# Patient Record
Sex: Female | Born: 1945
Health system: Southern US, Community
[De-identification: ages and names within clinical notes are randomized; demographics above are authoritative.]

## PROBLEM LIST (undated history)

## (undated) DIAGNOSIS — Z8719 Personal history of other diseases of the digestive system: Secondary | ICD-10-CM

## (undated) DIAGNOSIS — E785 Hyperlipidemia, unspecified: Secondary | ICD-10-CM

## (undated) DIAGNOSIS — M542 Cervicalgia: Secondary | ICD-10-CM

## (undated) DIAGNOSIS — M1990 Unspecified osteoarthritis, unspecified site: Secondary | ICD-10-CM

## (undated) DIAGNOSIS — I10 Essential (primary) hypertension: Secondary | ICD-10-CM

## (undated) DIAGNOSIS — I499 Cardiac arrhythmia, unspecified: Secondary | ICD-10-CM

## (undated) DIAGNOSIS — K5790 Diverticulosis of intestine, part unspecified, without perforation or abscess without bleeding: Secondary | ICD-10-CM

## (undated) DIAGNOSIS — R112 Nausea with vomiting, unspecified: Secondary | ICD-10-CM

## (undated) DIAGNOSIS — Z9889 Other specified postprocedural states: Secondary | ICD-10-CM

## (undated) DIAGNOSIS — K589 Irritable bowel syndrome without diarrhea: Secondary | ICD-10-CM

## (undated) HISTORY — PX: COLONOSCOPY: SHX174

## (undated) HISTORY — PX: ABDOMINAL HYSTERECTOMY: SHX81

## (undated) HISTORY — PX: APPENDECTOMY: SHX54

## (undated) HISTORY — PX: ESOPHAGOGASTRODUODENOSCOPY: SHX1529

---

## 2004-04-06 ENCOUNTER — Ambulatory Visit: Payer: Self-pay | Admitting: Family Medicine

## 2004-08-19 ENCOUNTER — Ambulatory Visit: Payer: Self-pay | Admitting: Family Medicine

## 2004-08-19 ENCOUNTER — Emergency Department (HOSPITAL_COMMUNITY): Admission: EM | Admit: 2004-08-19 | Discharge: 2004-08-20 | Payer: Self-pay | Admitting: Emergency Medicine

## 2004-10-03 ENCOUNTER — Ambulatory Visit: Payer: Self-pay | Admitting: Family Medicine

## 2005-03-14 ENCOUNTER — Ambulatory Visit: Payer: Self-pay | Admitting: Family Medicine

## 2005-04-18 ENCOUNTER — Ambulatory Visit: Payer: Self-pay | Admitting: Family Medicine

## 2005-11-21 ENCOUNTER — Ambulatory Visit: Payer: Self-pay | Admitting: Family Medicine

## 2006-02-20 ENCOUNTER — Ambulatory Visit: Payer: Self-pay | Admitting: Family Medicine

## 2006-06-07 ENCOUNTER — Ambulatory Visit: Payer: Self-pay | Admitting: Family Medicine

## 2006-10-03 ENCOUNTER — Ambulatory Visit: Payer: Self-pay | Admitting: Family Medicine

## 2011-06-15 ENCOUNTER — Encounter (HOSPITAL_COMMUNITY): Payer: Self-pay | Admitting: Pharmacy Technician

## 2011-06-15 ENCOUNTER — Other Ambulatory Visit (HOSPITAL_COMMUNITY): Payer: Self-pay | Admitting: Orthopaedic Surgery

## 2011-06-16 NOTE — Progress Notes (Signed)
Numerous attempt to reach Mrs. Misty Hanson, without success. At 2119 I left a message on Mr. Misty Hanson voice mail of time she should arrive at [facility], NPO after MN and to take Metoprolol with a sip of water prior to arrival.  Pre op phone number also given.

## 2011-06-18 MED ORDER — CEFAZOLIN SODIUM 1-5 GM-% IV SOLN: 1.0000 g | INTRAVENOUS | Status: DC

## 2011-06-19 ENCOUNTER — Inpatient Hospital Stay (HOSPITAL_COMMUNITY): Admission: RE | Admit: 2011-06-19 | Payer: Medicare Other | Source: Ambulatory Visit | Admitting: Orthopaedic Surgery

## 2011-06-19 ENCOUNTER — Encounter (HOSPITAL_COMMUNITY): Admission: RE | Payer: Self-pay | Source: Ambulatory Visit

## 2011-06-19 SURGERY — ANTERIOR CERVICAL DECOMPRESSION/DISCECTOMY FUSION 2 LEVELS
Anesthesia: General | Site: Neck

## 2011-06-30 ENCOUNTER — Encounter (HOSPITAL_COMMUNITY): Payer: Self-pay

## 2011-06-30 ENCOUNTER — Encounter (HOSPITAL_COMMUNITY)
Admission: RE | Admit: 2011-06-30 | Discharge: 2011-06-30 | Disposition: A | Discharge status: Home / Self Care | Payer: Medicare Other | Source: Ambulatory Visit | Attending: Orthopaedic Surgery | Admitting: Orthopaedic Surgery

## 2011-06-30 ENCOUNTER — Other Ambulatory Visit: Payer: Self-pay

## 2011-06-30 HISTORY — DX: Hyperlipidemia, unspecified: E78.5

## 2011-06-30 HISTORY — DX: Nausea with vomiting, unspecified: Z98.890

## 2011-06-30 HISTORY — DX: Cervicalgia: M54.2

## 2011-06-30 HISTORY — DX: Nausea with vomiting, unspecified: R11.2

## 2011-06-30 HISTORY — DX: Personal history of other diseases of the digestive system: Z87.19

## 2011-06-30 HISTORY — DX: Essential (primary) hypertension: I10

## 2011-06-30 HISTORY — DX: Cardiac arrhythmia, unspecified: I49.9

## 2011-06-30 HISTORY — DX: Diverticulosis of intestine, part unspecified, without perforation or abscess without bleeding: K57.90

## 2011-06-30 HISTORY — DX: Unspecified osteoarthritis, unspecified site: M19.90

## 2011-06-30 HISTORY — DX: Irritable bowel syndrome, unspecified: K58.9

## 2011-06-30 LAB — CBC
HCT: 42.4 % (ref 36.0–46.0)
Hemoglobin: 14.3 g/dL (ref 12.0–15.0)
MCHC: 33.7 g/dL (ref 30.0–36.0)
Platelets: 241 10*3/uL (ref 150–400)
RBC: 4.78 MIL/uL (ref 3.87–5.11)
WBC: 11.5 10*3/uL — ABNORMAL HIGH (ref 4.0–10.5)

## 2011-06-30 LAB — COMPREHENSIVE METABOLIC PANEL
ALT: 12 U/L (ref 0–35)
Alkaline Phosphatase: 78 U/L (ref 39–117)
CO2: 27 mEq/L (ref 19–32)
Chloride: 102 mEq/L (ref 96–112)
GFR calc Af Amer: >90 mL/min (ref >90)
GFR calc non Af Amer: >90 mL/min (ref >90)
Glucose, Bld: 90 mg/dL (ref 70–99)
Potassium: 3.2 mEq/L — ABNORMAL LOW (ref 3.5–5.1)
Sodium: 141 mEq/L (ref 135–145)
Total Bilirubin: 0.3 mg/dL (ref 0.3–1.2)

## 2011-06-30 LAB — URINALYSIS, ROUTINE W REFLEX MICROSCOPIC
Glucose, UA: NEGATIVE mg/dL
Hgb urine dipstick: NEGATIVE
Ketones, ur: NEGATIVE mg/dL
Protein, ur: NEGATIVE mg/dL
pH: 6 (ref 5.0–8.0)

## 2011-06-30 LAB — SURGICAL PCR SCREEN: MRSA, PCR: NEGATIVE

## 2011-06-30 NOTE — Pre-Procedure Instructions (Deleted)
20 Misty Hanson  06/30/2011   Your procedure is scheduled on:  Mon, Mar 11 @ 1230 pm  Report to [facility] at 1030 AM.  Call this number if you have problems the morning of surgery: [phone]   Remember:   Do not eat food:After Midnight.  May have clear liquids: up to 4 Hours before arrival.(until 6:30 am)  Clear liquids include soda, tea, black coffee, apple or grape juice, broth.  Take these medicines the morning of surgery with A SIP OF WATER:    Do not wear jewelry, make-up or nail polish.  Do not wear lotions, powders, or perfumes. You may wear deodorant.  Do not shave 48 hours prior to surgery.  Do not bring valuables to the hospital.  Contacts, dentures or bridgework may not be worn into surgery.  Leave suitcase in the car. After surgery it may be brought to your room.  For patients admitted to the hospital, checkout time is 11:00 AM the day of discharge.   Patients discharged the day of surgery will not be allowed to drive home.  Name and phone number of your driver:   Special Instructions: CHG Shower Use Special Wash: 1/2 bottle night before surgery and 1/2 bottle morning of surgery.   Please read over the following fact sheets that you were given: Pain Booklet, Coughing and Deep Breathing, Blood Transfusion Information and Surgical Site Infection Prevention

## 2011-06-30 NOTE — Pre-Procedure Instructions (Signed)
20 Misty Hanson  06/30/2011   Your procedure is scheduled on: Mon, Mar 11 @ 1230pm  Report to [facility] at 1030 AM.  Call this number if you have problems the morning of surgery: [phone]   Remember:   Do not eat food:After Midnight.  May have clear liquids: up to 4 Hours before arrival.(until 6:30 am)  Clear liquids include soda, tea, black coffee, apple or grape juice, broth.  Take these medicines the morning of surgery with A SIP OF WATER: Metoprolol   Do not wear jewelry, make-up or nail polish.  Do not wear lotions, powders, or perfumes. You may wear deodorant.  Do not shave 48 hours prior to surgery.  Do not bring valuables to the hospital.  Contacts, dentures or bridgework may not be worn into surgery.  Leave suitcase in the car. After surgery it may be brought to your room.  For patients admitted to the hospital, checkout time is 11:00 AM the day of discharge.   Patients discharged the day of surgery will not be allowed to drive home.  Name and phone number of your driver:   Special Instructions: CHG Shower Use Special Wash: 1/2 bottle night before surgery and 1/2 bottle morning of surgery.   Please read over the following fact sheets that you were given: Pain Booklet, Coughing and Deep Breathing, MRSA Information and Surgical Site Infection Prevention

## 2011-06-30 NOTE — Progress Notes (Addendum)
Dr.Nyland is medical MD and manages HTn;pt doesn't have a cardiologist  Denies ever having an echo/stress/heart cath

## 2011-07-04 NOTE — Consult Note (Signed)
Anesthesia:  Patient is a 66 year old female scheduled for C5-6, C6-7 ACDF on 07/10/11.  History includes HTN, HLD, HH, arthritis, IBS, diverticulosis, "dysrhythmia".  She is on beta blocker therapy.  Labs and CXR noted.  EKG shows NSR, minimal voltage criteria for LVH (may be normal variant), poor R wave progression.  Currently there are no comparison EKGs available.  No CV symptoms reported.  There is no documented history of CAD/MI/CHF or DM.  Clinical correlation on the day of surgery. If remains asymptomatic then anticipate she can proceed as planned.

## 2011-07-10 ENCOUNTER — Encounter (HOSPITAL_COMMUNITY): Payer: Self-pay | Admitting: Vascular Surgery

## 2011-07-10 ENCOUNTER — Encounter (HOSPITAL_COMMUNITY)
Admission: RE | Disposition: A | Discharge status: Home / Self Care | Payer: Self-pay | Source: Ambulatory Visit | Attending: Orthopaedic Surgery

## 2011-07-10 ENCOUNTER — Inpatient Hospital Stay (HOSPITAL_COMMUNITY)
Admission: RE | Admit: 2011-07-10 | Discharge: 2011-07-11 | DRG: 473 | Disposition: A | Discharge status: Home / Self Care | Payer: Medicare Other | Source: Ambulatory Visit | Attending: Orthopaedic Surgery | Admitting: Orthopaedic Surgery

## 2011-07-10 ENCOUNTER — Inpatient Hospital Stay (HOSPITAL_COMMUNITY): Payer: Medicare Other

## 2011-07-10 ENCOUNTER — Inpatient Hospital Stay (HOSPITAL_COMMUNITY): Payer: Medicare Other | Admitting: Vascular Surgery

## 2011-07-10 DIAGNOSIS — E785 Hyperlipidemia, unspecified: Secondary | ICD-10-CM | POA: Diagnosis present

## 2011-07-10 DIAGNOSIS — M4802 Spinal stenosis, cervical region: Secondary | ICD-10-CM | POA: Diagnosis present

## 2011-07-10 DIAGNOSIS — M47812 Spondylosis without myelopathy or radiculopathy, cervical region: Principal | ICD-10-CM | POA: Diagnosis present

## 2011-07-10 DIAGNOSIS — M5020 Other cervical disc displacement, unspecified cervical region: Secondary | ICD-10-CM | POA: Diagnosis present

## 2011-07-10 DIAGNOSIS — I10 Essential (primary) hypertension: Secondary | ICD-10-CM | POA: Diagnosis present

## 2011-07-10 HISTORY — PX: ANTERIOR CERVICAL DECOMP/DISCECTOMY FUSION: SHX1161

## 2011-07-10 SURGERY — ANTERIOR CERVICAL DECOMPRESSION/DISCECTOMY FUSION 2 LEVELS
Anesthesia: General | Site: Neck | Wound class: Clean

## 2011-07-10 MED ORDER — KCL IN DEXTROSE-NACL 20-5-0.45 MEQ/L-%-% IV SOLN
INTRAVENOUS | Status: DC
  Administered 2011-07-10: 16:00:00 via INTRAVENOUS
  Filled 2011-07-10 (×4): qty 1000

## 2011-07-10 MED ORDER — PHENOL 1.4 % MT LIQD: 1.0000 | OROMUCOSAL | Status: DC | PRN

## 2011-07-10 MED ORDER — SODIUM CHLORIDE 0.9 % IJ SOLN: 3.0000 mL | Freq: Two times a day (BID) | INTRAMUSCULAR | Status: DC

## 2011-07-10 MED ORDER — SENNOSIDES-DOCUSATE SODIUM 8.6-50 MG PO TABS: 1.0000 | ORAL_TABLET | Freq: Every evening | ORAL | Status: DC | PRN

## 2011-07-10 MED ORDER — PHENYLEPHRINE HCL 10 MG/ML IJ SOLN
20.0000 mg | INTRAVENOUS | Status: DC | PRN
  Administered 2011-07-10: 20 ug/min via INTRAVENOUS

## 2011-07-10 MED ORDER — OXYCODONE-ACETAMINOPHEN 5-325 MG PO TABS
1.0000 | ORAL_TABLET | ORAL | Status: DC | PRN
  Administered 2011-07-10: 2 via ORAL
  Filled 2011-07-10: qty 2

## 2011-07-10 MED ORDER — ONDANSETRON HCL 4 MG/2ML IJ SOLN: 4.0000 mg | INTRAMUSCULAR | Status: DC | PRN

## 2011-07-10 MED ORDER — LACTATED RINGERS IV SOLN
INTRAVENOUS | Status: DC | PRN
  Administered 2011-07-10 (×2): via INTRAVENOUS

## 2011-07-10 MED ORDER — MIDAZOLAM HCL 5 MG/5ML IJ SOLN
INTRAMUSCULAR | Status: DC | PRN
  Administered 2011-07-10 (×2): 1 mg via INTRAVENOUS

## 2011-07-10 MED ORDER — PANTOPRAZOLE SODIUM 40 MG IV SOLR
40.0000 mg | Freq: Every day | INTRAVENOUS | Status: DC
  Administered 2011-07-10: 40 mg via INTRAVENOUS
  Filled 2011-07-10 (×2): qty 40

## 2011-07-10 MED ORDER — HYDROCODONE-ACETAMINOPHEN 5-325 MG PO TABS
1.0000 | ORAL_TABLET | ORAL | Status: DC | PRN
  Filled 2011-07-10: qty 1

## 2011-07-10 MED ORDER — MORPHINE SULFATE 4 MG/ML IJ SOLN
1.0000 mg | INTRAMUSCULAR | Status: DC | PRN
  Administered 2011-07-11 (×2): 2 mg via INTRAVENOUS
  Administered 2011-07-11: 4 mg via INTRAVENOUS
  Filled 2011-07-10: qty 1

## 2011-07-10 MED ORDER — KETOROLAC TROMETHAMINE 30 MG/ML IJ SOLN
30.0000 mg | Freq: Once | INTRAMUSCULAR | Status: AC
  Administered 2011-07-10: 30 mg via INTRAVENOUS

## 2011-07-10 MED ORDER — LACTATED RINGERS IV SOLN
INTRAVENOUS | Status: DC
  Administered 2011-07-10: 12:00:00 via INTRAVENOUS

## 2011-07-10 MED ORDER — ROCURONIUM BROMIDE 100 MG/10ML IV SOLN
INTRAVENOUS | Status: DC | PRN
  Administered 2011-07-10: 50 mg via INTRAVENOUS
  Administered 2011-07-10: 10 mg via INTRAVENOUS

## 2011-07-10 MED ORDER — ACETAMINOPHEN 10 MG/ML IV SOLN
INTRAVENOUS | Status: DC | PRN
  Administered 2011-07-10: 1000 mg via INTRAVENOUS

## 2011-07-10 MED ORDER — GLYCOPYRROLATE 0.2 MG/ML IJ SOLN
INTRAMUSCULAR | Status: DC | PRN
  Administered 2011-07-10: 0.4 mg via INTRAVENOUS

## 2011-07-10 MED ORDER — ZOLPIDEM TARTRATE 5 MG PO TABS: 5.0000 mg | ORAL_TABLET | Freq: Every evening | ORAL | Status: DC | PRN

## 2011-07-10 MED ORDER — BISACODYL 10 MG RE SUPP: 10.0000 mg | Freq: Every day | RECTAL | Status: DC | PRN

## 2011-07-10 MED ORDER — MENTHOL 3 MG MT LOZG: 1.0000 | LOZENGE | OROMUCOSAL | Status: DC | PRN

## 2011-07-10 MED ORDER — BUPIVACAINE-EPINEPHRINE 0.25% -1:200000 IJ SOLN
INTRAMUSCULAR | Status: DC | PRN
  Administered 2011-07-10: 6 mL

## 2011-07-10 MED ORDER — HYDROMORPHONE HCL PF 1 MG/ML IJ SOLN
0.2500 mg | INTRAMUSCULAR | Status: DC | PRN
  Administered 2011-07-10 (×2): 0.5 mg via INTRAVENOUS

## 2011-07-10 MED ORDER — METHOCARBAMOL 100 MG/ML IJ SOLN
500.0000 mg | Freq: Four times a day (QID) | INTRAVENOUS | Status: DC | PRN
  Filled 2011-07-10: qty 5

## 2011-07-10 MED ORDER — LIDOCAINE HCL (CARDIAC) 20 MG/ML IV SOLN
INTRAVENOUS | Status: DC | PRN
  Administered 2011-07-10: 80 mg via INTRAVENOUS

## 2011-07-10 MED ORDER — KCL IN DEXTROSE-NACL 20-5-0.45 MEQ/L-%-% IV SOLN
INTRAVENOUS | Status: AC
  Filled 2011-07-10: qty 1000

## 2011-07-10 MED ORDER — DOCUSATE SODIUM 100 MG PO CAPS
100.0000 mg | ORAL_CAPSULE | Freq: Two times a day (BID) | ORAL | Status: DC
  Administered 2011-07-10 – 2011-07-11 (×2): 100 mg via ORAL
  Filled 2011-07-10 (×3): qty 1

## 2011-07-10 MED ORDER — SODIUM CHLORIDE 0.9 % IJ SOLN: 3.0000 mL | INTRAMUSCULAR | Status: DC | PRN

## 2011-07-10 MED ORDER — PROPOFOL 10 MG/ML IV EMUL
INTRAVENOUS | Status: DC | PRN
  Administered 2011-07-10: 160 mg via INTRAVENOUS

## 2011-07-10 MED ORDER — SODIUM CHLORIDE 0.9 % IV SOLN: 250.0000 mL | INTRAVENOUS | Status: DC

## 2011-07-10 MED ORDER — NEOSTIGMINE METHYLSULFATE 1 MG/ML IJ SOLN
INTRAMUSCULAR | Status: DC | PRN
  Administered 2011-07-10: 3 mg via INTRAVENOUS

## 2011-07-10 MED ORDER — ACETAMINOPHEN 10 MG/ML IV SOLN
INTRAVENOUS | Status: AC
  Filled 2011-07-10: qty 100

## 2011-07-10 MED ORDER — DEXTROSE 5 % IV SOLN
INTRAVENOUS | Status: DC | PRN
  Administered 2011-07-10 (×2): via INTRAVENOUS

## 2011-07-10 MED ORDER — CEFAZOLIN SODIUM 1-5 GM-% IV SOLN
INTRAVENOUS | Status: AC
  Filled 2011-07-10: qty 100

## 2011-07-10 MED ORDER — ACETAMINOPHEN 650 MG RE SUPP: 650.0000 mg | RECTAL | Status: DC | PRN

## 2011-07-10 MED ORDER — METOPROLOL TARTRATE 12.5 MG HALF TABLET
12.5000 mg | ORAL_TABLET | Freq: Two times a day (BID) | ORAL | Status: DC
  Administered 2011-07-10 – 2011-07-11 (×2): 12.5 mg via ORAL
  Filled 2011-07-10 (×3): qty 1

## 2011-07-10 MED ORDER — METHOCARBAMOL 500 MG PO TABS
500.0000 mg | ORAL_TABLET | Freq: Four times a day (QID) | ORAL | Status: DC | PRN
  Administered 2011-07-10: 500 mg via ORAL
  Filled 2011-07-10: qty 1

## 2011-07-10 MED ORDER — DROPERIDOL 2.5 MG/ML IJ SOLN
INTRAMUSCULAR | Status: DC | PRN
  Administered 2011-07-10: .625 mg via INTRAVENOUS

## 2011-07-10 MED ORDER — CEFAZOLIN SODIUM 1-5 GM-% IV SOLN
INTRAVENOUS | Status: DC | PRN
  Administered 2011-07-10: 2 g via INTRAVENOUS

## 2011-07-10 MED ORDER — ONDANSETRON HCL 4 MG/2ML IJ SOLN
INTRAMUSCULAR | Status: DC | PRN
  Administered 2011-07-10 (×2): 4 mg via INTRAVENOUS

## 2011-07-10 MED ORDER — CEFAZOLIN SODIUM 1-5 GM-% IV SOLN
1.0000 g | Freq: Three times a day (TID) | INTRAVENOUS | Status: AC
  Administered 2011-07-10 – 2011-07-11 (×2): 1 g via INTRAVENOUS
  Filled 2011-07-10 (×2): qty 50

## 2011-07-10 MED ORDER — ACETAMINOPHEN 325 MG PO TABS: 650.0000 mg | ORAL_TABLET | ORAL | Status: DC | PRN

## 2011-07-10 MED ORDER — ONDANSETRON HCL 4 MG/2ML IJ SOLN: 4.0000 mg | Freq: Four times a day (QID) | INTRAMUSCULAR | Status: DC | PRN

## 2011-07-10 MED ORDER — FENTANYL CITRATE 0.05 MG/ML IJ SOLN
INTRAMUSCULAR | Status: DC | PRN
  Administered 2011-07-10: 100 ug via INTRAVENOUS
  Administered 2011-07-10: 50 ug via INTRAVENOUS
  Administered 2011-07-10: 100 ug via INTRAVENOUS

## 2011-07-10 SURGICAL SUPPLY — 51 items
BENZOIN TINCTURE PRP APPL 2/3 (GAUZE/BANDAGES/DRESSINGS) ×4 IMPLANT
BLADE SURG ROTATE 9660 (MISCELLANEOUS) IMPLANT
BUR ROUND FLUTED 4 SOFT TCH (BURR) IMPLANT
CLOTH BEACON ORANGE TIMEOUT ST (SAFETY) ×2 IMPLANT
COLLAR CERV LO CONTOUR FIRM DE (SOFTGOODS) ×2 IMPLANT
COMPOSITE CERV ALLOGRAFT 6MM (Orthopedic Implant) ×2 IMPLANT
COMPOSITE CERV ALLOGRAFT 7MM (Orthopedic Implant) ×2 IMPLANT
CORDS BIPOLAR (ELECTRODE) ×2 IMPLANT
COVER MAYO STAND STRL (DRAPES) ×2 IMPLANT
COVER SURGICAL LIGHT HANDLE (MISCELLANEOUS) ×2 IMPLANT
DRAPE C-ARM 42X72 X-RAY (DRAPES) ×2 IMPLANT
DRAPE MICROSCOPE LEICA (MISCELLANEOUS) ×2 IMPLANT
DRAPE PROXIMA HALF (DRAPES) ×2 IMPLANT
DURAPREP 6ML APPLICATOR 50/CS (WOUND CARE) ×2 IMPLANT
ELECT COATED BLADE 2.86 ST (ELECTRODE) ×2 IMPLANT
ELECT REM PT RETURN 9FT ADLT (ELECTROSURGICAL) ×2
ELECTRODE REM PT RTRN 9FT ADLT (ELECTROSURGICAL) ×1 IMPLANT
EVACUATOR 1/8 PVC DRAIN (DRAIN) ×2 IMPLANT
GAUZE XEROFORM 1X8 LF (GAUZE/BANDAGES/DRESSINGS) ×2 IMPLANT
GLOVE BIOGEL PI IND STRL 7.5 (GLOVE) ×1 IMPLANT
GLOVE BIOGEL PI IND STRL 8 (GLOVE) ×1 IMPLANT
GLOVE BIOGEL PI INDICATOR 7.5 (GLOVE) ×1
GLOVE BIOGEL PI INDICATOR 8 (GLOVE) ×1
GLOVE ECLIPSE 7.0 STRL STRAW (GLOVE) ×2 IMPLANT
GLOVE ORTHO TXT STRL SZ7.5 (GLOVE) ×2 IMPLANT
GOWN PREVENTION PLUS LG XLONG (DISPOSABLE) IMPLANT
GOWN PREVENTION PLUS XLARGE (GOWN DISPOSABLE) ×2 IMPLANT
GOWN STRL NON-REIN LRG LVL3 (GOWN DISPOSABLE) ×4 IMPLANT
HEAD HALTER (SOFTGOODS) ×2 IMPLANT
HEMOSTAT SURGICEL 2X14 (HEMOSTASIS) IMPLANT
KIT BASIN OR (CUSTOM PROCEDURE TRAY) ×2 IMPLANT
KIT ROOM TURNOVER OR (KITS) ×2 IMPLANT
MANIFOLD NEPTUNE II (INSTRUMENTS) ×2 IMPLANT
NEEDLE 25GX 5/8IN NON SAFETY (NEEDLE) ×2 IMPLANT
NS IRRIG 1000ML POUR BTL (IV SOLUTION) ×2 IMPLANT
PACK ORTHO CERVICAL (CUSTOM PROCEDURE TRAY) ×2 IMPLANT
PAD ARMBOARD 7.5X6 YLW CONV (MISCELLANEOUS) ×4 IMPLANT
PATTIES SURGICAL .5 X.5 (GAUZE/BANDAGES/DRESSINGS) IMPLANT
SPONGE GAUZE 4X4 12PLY (GAUZE/BANDAGES/DRESSINGS) ×2 IMPLANT
SPONGE GAUZE 4X4 STERILE 39 (GAUZE/BANDAGES/DRESSINGS) ×2 IMPLANT
STAPLER VISISTAT 35W (STAPLE) ×2 IMPLANT
STRIP CLOSURE SKIN 1/2X4 (GAUZE/BANDAGES/DRESSINGS) ×2 IMPLANT
SURGIFLO TRUKIT (HEMOSTASIS) IMPLANT
SUT VIC AB 3-0 X1 27 (SUTURE) ×2 IMPLANT
SUT VICRYL 4-0 PS2 18IN ABS (SUTURE) ×4 IMPLANT
SYR 30ML SLIP (SYRINGE) ×2 IMPLANT
SYR BULB 3OZ (MISCELLANEOUS) ×2 IMPLANT
TAPE CLOTH SURG 6X10 WHT LF (GAUZE/BANDAGES/DRESSINGS) ×2 IMPLANT
TOWEL OR 17X24 6PK STRL BLUE (TOWEL DISPOSABLE) ×2 IMPLANT
TOWEL OR 17X26 10 PK STRL BLUE (TOWEL DISPOSABLE) ×2 IMPLANT
WATER STERILE IRR 1000ML POUR (IV SOLUTION) ×2 IMPLANT

## 2011-07-10 NOTE — H&P (Signed)
Misty Hanson is an 66 y.o. female.   Chief Complaint: neck pain and right arm pain HPI: with insidious onset of neck and right arm pain in December 2012.  Gradual worsening of pain into the right arm with no relief with oral NSAIDS or analgesics.  Now with weakness in the right triceps.  MRI with central stenosis C5-6 with narrowing to 8mm and spondylosis of C6-7 with disc bulge into the foramen affecting the right C7 nerve root. After risks and benefits discussed with Dr Ophelia Charter the patient wishes to proceed with surgical intervention.  Past Medical History  Diagnosis Date  . PONV (postoperative nausea and vomiting)   . Hypertension     takes Metoprolol daily  . Dysrhythmia     unknown arrythmia  . Hyperlipidemia     takes Fish Oil daily  . IBS (irritable bowel syndrome)   . Arthritis   . Neck pain     HNP  . H/O hiatal hernia   . Diverticulosis     Past Surgical History  Procedure Date  . Abdominal hysterectomy   . Appendectomy   . Cesarean section   . Colonoscopy   . Esophagogastroduodenoscopy     Family History  Problem Relation Age of Onset  . Anesthesia problems Neg Hx   . Hypotension Neg Hx   . Malignant hyperthermia Neg Hx   . Pseudochol deficiency Neg Hx    Social History:  does not have a smoking history on file. She does not have any smokeless tobacco history on file. She reports that she does not drink alcohol. Her drug history not on file.  Allergies:  Allergies  Allergen Reactions  . Caffeine Rash  . Cortisone Rash    Medications Prior to Admission  Medication Dose Route Frequency Provider Last Rate Last Dose  . lactated ringers infusion   Intravenous Continuous Raiford Simmonds, MD 50 mL/hr at 07/10/11 1205     Medications Prior to Admission  Medication Sig Dispense Refill  . estradiol (CLIMARA) 0.06 MG/24HR Place 1 patch onto the skin once a week. On Saturdays      . fish oil-omega-3 fatty acids 1000 MG capsule Take 1 g by mouth daily.      .  metoprolol tartrate (LOPRESSOR) 25 MG tablet Take 12.5 mg by mouth 2 (two) times daily.      . Vitamin D, Ergocalciferol, (DRISDOL) 50000 UNITS CAPS Take 50,000 Units by mouth every 7 (seven) days. On Weds        No results found for this or any previous visit (from the past 48 hour(s)). No results found.  Review of Systems  Constitutional: Negative.   HENT: Positive for neck pain.   Eyes: Negative.   Respiratory: Negative.   Cardiovascular: Negative.   Gastrointestinal: Negative.   Genitourinary: Negative.   Skin: Negative.   Neurological: Negative.   Psychiatric/Behavioral: Negative.     Blood pressure 152/80, pulse 68, temperature 97.5 F (36.4 C), resp. rate 20, SpO2 100.00%. Physical Exam  Constitutional: She is oriented to person, place, and time. She appears well-developed and well-nourished.  HENT:  Head: Normocephalic and atraumatic.  Eyes: EOM are normal. Pupils are equal, round, and reactive to light.  Neck:       Decrease ROM cervical spine Positive spurling to right Positive brachial plexus tenderness right  Cardiovascular: Normal rate and regular rhythm.   Respiratory: Effort normal and breath sounds normal.  GI: Soft.  Musculoskeletal:       Right triceps  weakness 4/5 Left triceps 5/5  DTRs: Biceps and BR +2 and sym. Right triceps +1  Left triceps +2 Weakness of right finger extensors  Neurological: She is alert and oriented to person, place, and time.  Skin: Skin is warm and dry.  Psychiatric: She has a normal mood and affect.     Assessment/Plan Cervical stenosis and spondylosis at C5-6 and C6-7 with right C7 radiculopathy  PLAN: Anterior cervical discectomy and fusion at C5-6 and C6-7  Misty Hanson M 07/10/2011, 12:38 PM

## 2011-07-10 NOTE — Anesthesia Postprocedure Evaluation (Signed)
  Anesthesia Post-op Note  Patient: Misty Hanson  Procedure(s) Performed: Procedure(s) (LRB): ANTERIOR CERVICAL DECOMPRESSION/DISCECTOMY FUSION 2 LEVELS (N/A)  Patient Location: PACU  Anesthesia Type: General  Level of Consciousness: awake, oriented and patient cooperative  Airway and Oxygen Therapy: Patient Spontanous Breathing and Patient connected to nasal cannula oxygen  Post-op Pain: mild  Post-op Assessment: Post-op Vital signs reviewed, Patient's Cardiovascular Status Stable, Respiratory Function Stable, Patent Airway, No signs of Nausea or vomiting and Pain level controlled  Post-op Vital Signs: stable  Complications: No apparent anesthesia complications

## 2011-07-10 NOTE — Anesthesia Preprocedure Evaluation (Addendum)
Anesthesia Evaluation  Patient identified by MRN, date of birth, ID band Patient awake    Reviewed: Allergy & Precautions, H&P , NPO status , Patient's Chart, lab work & pertinent test results  History of Anesthesia Complications (+) PONV  Airway Mallampati: II TM Distance: >3 FB Neck ROM: limited    Dental  (+) Teeth Intact and Caps   Pulmonary          Cardiovascular hypertension, Pt. on home beta blockers + dysrhythmias     Neuro/Psych    GI/Hepatic hiatal hernia,   Endo/Other    Renal/GU      Musculoskeletal   Abdominal   Peds  Hematology   Anesthesia Other Findings Lateral incisors capped.  Reproductive/Obstetrics                         Anesthesia Physical Anesthesia Plan  ASA: II  Anesthesia Plan: General   Post-op Pain Management:    Induction: Intravenous  Airway Management Planned: Oral ETT  Additional Equipment:   Intra-op Plan:   Post-operative Plan: Extubation in OR  Informed Consent: I have reviewed the patients History and Physical, chart, labs and discussed the procedure including the risks, benefits and alternatives for the proposed anesthesia with the patient or authorized representative who has indicated his/her understanding and acceptance.     Plan Discussed with: CRNA and Surgeon  Anesthesia Plan Comments:         Anesthesia Quick Evaluation

## 2011-07-10 NOTE — Anesthesia Procedure Notes (Signed)
Procedure Name: Intubation Date/Time: 07/10/2011 12:55 PM Performed by: Darcey Nora B Pre-anesthesia Checklist: Patient being monitored, Suction available, Emergency Drugs available, Patient identified and Timeout performed Patient Re-evaluated:Patient Re-evaluated prior to inductionOxygen Delivery Method: Circle system utilized Preoxygenation: Pre-oxygenation with 100% oxygen Intubation Type: IV induction Ventilation: Mask ventilation without difficulty Laryngoscope Size: Mac and 3 Grade View: Grade II Tube type: Oral Tube size: 7.0 mm Number of attempts: 1 Airway Equipment and Method: Stylet Placement Confirmation: ETT inserted through vocal cords under direct vision,  positive ETCO2 and breath sounds checked- equal and bilateral Secured at: 1 cm Tube secured with: Tape Dental Injury: Teeth and Oropharynx as per pre-operative assessment

## 2011-07-10 NOTE — Transfer of Care (Signed)
Immediate Anesthesia Transfer of Care Note  Patient: Misty Hanson  Procedure(s) Performed: Procedure(s) (LRB): ANTERIOR CERVICAL DECOMPRESSION/DISCECTOMY FUSION 2 LEVELS (N/A)  Patient Location: PACU  Anesthesia Type: General  Level of Consciousness: awake, alert , oriented and patient cooperative  Airway & Oxygen Therapy: Patient Spontanous Breathing and Patient connected to nasal cannula oxygen  Post-op Assessment: Report given to PACU RN and Post -op Vital signs reviewed and stable  Post vital signs: Reviewed and stable  Complications: No apparent anesthesia complications

## 2011-07-10 NOTE — Brief Op Note (Cosign Needed)
07/10/2011  2:49 PM  PATIENT:  Misty Hanson  66 y.o. female  PRE-OPERATIVE DIAGNOSIS:  C5-6, C6-7 HNP, Spondylosis, Right C7 Radiculopathy  POST-OPERATIVE DIAGNOSIS:  Cervical 5-6, Cervical 6-7 Herniated Nucleic Pulposis, Spondylosis, Right C7 Radiculopathy  PROCEDURE:  Procedure(s) (LRB): ANTERIOR CERVICAL DECOMPRESSION/DISCECTOMY FUSION 2 LEVELS (N/A) C5-6 (6 mm graft) and C6-7 (7mm graft), 2 level plate (30mm) and 6 screws  SURGEON:  Surgeon(s) and Role:    * Eldred Manges, MD - Primary  PHYSICIAN ASSISTANT: Maud Deed [facility]  ASSISTANTS: none   ANESTHESIA:   general  EBL:  Total I/O In: 1200 [I.V.:1200] Out: 25 [Blood:25]  BLOOD ADMINISTERED:none  DRAINS: (one) Hemovact drain(s) in the anterior neck with  Suction Open   LOCAL MEDICATIONS USED:  MARCAINE     SPECIMEN:  No Specimen  DISPOSITION OF SPECIMEN:  N/A  COUNTS:  YES  TOURNIQUET:  * No tourniquets in log *  DICTATION: .Note written in EPIC  PLAN OF CARE: Admit for overnight observation  PATIENT DISPOSITION:  PACU - hemodynamically stable.   Delay start of Pharmacological VTE agent (>24hrs) due to surgical blood loss or risk of bleeding: yes

## 2011-07-10 NOTE — Preoperative (Addendum)
Metoprolol 25 taken @ 7am today

## 2011-07-11 MED ORDER — METHOCARBAMOL 500 MG PO TABS: 500.0000 mg | ORAL_TABLET | Freq: Four times a day (QID) | ORAL | Status: AC | PRN

## 2011-07-11 MED ORDER — OXYCODONE-ACETAMINOPHEN 5-325 MG PO TABS: 1.0000 | ORAL_TABLET | ORAL | Status: AC | PRN

## 2011-07-11 NOTE — Progress Notes (Signed)
UR COMPLETED  

## 2011-07-11 NOTE — Progress Notes (Signed)
Subjective: Pain well controlled.  Mild sore throat.  No arm pain.  Ambulating well.  Ready for discharge home with husband   Objective: Vital signs in last 24 hours: Temp:  [97.7 F (36.5 C)-98.5 F (36.9 C)] 98.3 F (36.8 C) (03/12 0553) Pulse Rate:  [63-91] 66  (03/12 0553) Resp:  [12-18] 18  (03/12 0553) BP: (130-167)/(57-78) 130/71 mmHg (03/12 0553) SpO2:  [97 %-100 %] 97 % (03/12 0553)  Intake/Output from previous day: 03/11 0701 - 03/12 0700 In: 3640 [P.O.:740; I.V.:2900] Out: 835 [Urine:650; Drains:160; Blood:25] Intake/Output this shift:    No results found for this basename: HGB:5 in the last 72 hours No results found for this basename: WBC:2,RBC:2,HCT:2,PLT:2 in the last 72 hours No results found for this basename: NA:2,K:2,CL:2,CO2:2,BUN:2,CREATININE:2,GLUCOSE:2,CALCIUM:2 in the last 72 hours No results found for this basename: LABPT:2,INR:2 in the last 72 hours  Neurovascular intact Intact pulses distally Incision: hemovac drain removed without difficulty. no drainage  Assessment/Plan: Discharge home. OV one week with Dr Ophelia Charter Soft collar at all times RX percocet and robaxin COD stable   Misty Hanson 07/11/2011, 12:05 PM

## 2011-07-11 NOTE — Op Note (Signed)
Misty Hanson, Misty Hanson                 ACCOUNT NO.:  [id number]  MEDICAL RECORD NO.:  [id number]  LOCATION:  5039                         FACILITY:  [facility]  PHYSICIAN:  Jewel Mcafee C. Ophelia Charter, M.D.    DATE OF BIRTH:  01-Jul-1945  DATE OF PROCEDURE:  07/10/2011 DATE OF DISCHARGE:                              OPERATIVE REPORT   POSTOPERATIVE DIAGNOSIS:  Cervical spondylosis, C5-6 C6-7 with right C6- 7 herniated nucleus pulposus and radiculopathy.  PROCEDURES:  C5-C6, C6-C7 anterior cervical diskectomy and fusion, allograft, plate.  SURGEON:  Cherlynn Popiel C. Ophelia Charter, MD ASSISTANT:   Maud Deed PA-C medically necessary and present for the entire procedure.  ANESTHESIA:  GOT plus 6 mL Marcaine skin local.  COMPLICATIONS:  None.  Biomet 30 mm VueLock plate, 14 mm screws x6, 6-mm cortical cancellous allograft at C5-6 and 7 mm at C6-7.  PROCEDURE:  After induction of anesthesia, orotracheal intubation, preoperative Ancef prophylaxis, standard prepping and draping, sterile Mayo stand at the head, sterile skin marker on the neck and Betadine bio drape, and thyroid sheets and drapes.  Time-out procedure was completed. MRI scan was displayed. Patient had HNP right paracentral at C6-7 with triceps weakness and radiculopathy.  Significant spondylosis C5-6 with combination of hard and soft disk.  Time-out procedure was completed.  Incision was made starting at the midline extending to the left.  Platysma was divided in line with the fibers.  Blunt dissection down the level longus coli muscle was performed. Palpable spurs C5-6 C6-7 were noted and short 25 needle was placed at C6 with a straight clamp. Cross-table lateral x-ray was used to confirm the appropriate level.  Microdiskectomy was performed at C6-7 level.  Operative microscope was brought in and posterior longitudinal ligament was exposed. Spurs were removed.  There was extruded fragments over on the right side with the disk protrusion and spurring, which  was removed with combination of 1-2 mm Kerrisons Cloward curettes and Karlin curettes.  Black nerve hook was used for palpation.  No extruded fragments were noted. Spurs removed on both the right and left side. Uncovertebral joints were stripped.  Endplates were prepared with the bur and then rasped and sizing showed a 7-mm gave an excellent tight fit.  Graft was rehydrated, marked anteriorly in the midline. _CRNA_________ pulled with horseshoe head holder traction, and the graft was countersunk 2 mm.  Identical procedure was then repeated at the C5-6 level where there was significant spondylitic disease.  Spurs were removed.  Progressing back to the posterior longitudinal ligament. There were touching spurs about 1 mm disk space posteriorly and the spurs were picked off of the cord removed decompressing the dura, completely across. Uncovertebral joints were stripped. Foramina was enlarged and sizing showed a  6-mm graft at this level gave a tight fit. Graft was marked, rehydrated in a 20 mL syringe, pulling suction and then tapping with a hammer.  Graft was certainly countersunk 1-2 mm hand C-arm was brought in, checked and plate was selected, adjusted, checked in AP and lateral.  Initially, plate was a little long.  Shorter plate was used finally filling on 30 mm. One prong was used.  All the plates were adjusted  and screws were inserted, checked under fluoroscopy intermittently with all 6 screws were inserted with good position alignment.  The patient tolerated the procedure well.  Instrument count, needle count was correct and was transferred to the recovery room. Closure was with the 3- 0 Vicryl and the platysma 4-0 Vicryl subcuticular closure.  Tincture of benzoin, Steri-Strips, Marcaine infiltration, 4x4s tape and soft cervical collar.  Time-out procedure was completed at the end of the case.  Procedure was as posted.  Instrument count, needle count was correct.  Hemodynamically  stable in the recovery room.     Joeli Fenner C. Ophelia Charter, M.D.     MCY/MEDQ  D:  07/10/2011  T:  07/11/2011  Job:  [id number]

## 2011-07-11 NOTE — Discharge Summary (Signed)
Physician Discharge Summary  Patient ID: Misty Hanson MRN: [id number] DOB/AGE: 67-08-1945 67 y.o.  Admit date: 07/10/2011 Discharge date: 07/11/2011  Admission Diagnoses:  Stenosis of cervical spine C4-5 and C5-6  Discharge Diagnoses:  Principal Problem:  *Stenosis of cervical spine C4-5 and C5-6  Past Medical History  Diagnosis Date  . PONV (postoperative nausea and vomiting)   . Hypertension     takes Metoprolol daily  . Dysrhythmia     unknown arrythmia  . Hyperlipidemia     takes Fish Oil daily  . IBS (irritable bowel syndrome)   . Arthritis   . Neck pain     HNP  . H/O hiatal hernia   . Diverticulosis     Surgeries: Procedure(s): ANTERIOR CERVICAL DECOMPRESSION/DISCECTOMY FUSION 2 LEVELS on 07/10/2011 C4-5 and C5-6 Consultants (if any):    Discharged Condition: Improved  Hospital Course: Misty Hanson is an 66 y.o. female who was admitted 07/10/2011 with a diagnosis of Stenosis of cervical spine and went to the operating room on 07/10/2011 and underwent the above named procedures.    She was given perioperative antibiotics:  Anti-infectives     Start     Dose/Rate Route Frequency Ordered Stop   07/10/11 2100   ceFAZolin (ANCEF) IVPB 1 g/50 mL premix        1 g 100 mL/hr over 30 Minutes Intravenous Every 8 hours 07/10/11 1655 07/11/11 0507        .  She was given sequential compression devices, early ambulation.  She benefited maximally from the hospital stay and there were no complications.    Recent vital signs:  Filed Vitals:   07/11/11 0553  BP: 130/71  Pulse: 66  Temp: 98.3 F (36.8 C)  Resp: 18    Recent laboratory studies:  Lab Results  Component Value Date   HGB 14.3 06/30/2011   Lab Results  Component Value Date   WBC 11.5* 06/30/2011   PLT 241 06/30/2011   Lab Results  Component Value Date   INR 0.95 06/30/2011   Lab Results  Component Value Date   NA 141 06/30/2011   K 3.2* 06/30/2011   CL 102 06/30/2011   CO2 27 06/30/2011   BUN 13  06/30/2011   CREATININE 0.59 06/30/2011   GLUCOSE 90 06/30/2011    Discharge Medications:   Medication List  As of 07/11/2011 12:12 PM   TAKE these medications         estradiol 0.06 MG/24HR   Commonly known as: CLIMARA   Place 1 patch onto the skin once a week. On Saturdays      fish oil-omega-3 fatty acids 1000 MG capsule   Take 1 g by mouth daily.      methocarbamol 500 MG tablet   Commonly known as: ROBAXIN   Take 1 tablet (500 mg total) by mouth every 6 (six) hours as needed.      metoprolol tartrate 25 MG tablet   Commonly known as: LOPRESSOR   Take 12.5 mg by mouth 2 (two) times daily.      oxyCODONE-acetaminophen 5-325 MG per tablet   Commonly known as: PERCOCET   Take 1-2 tablets by mouth every 4 (four) hours as needed.      Vitamin D (Ergocalciferol) 50000 UNITS Caps   Commonly known as: DRISDOL   Take 50,000 Units by mouth every 7 (seven) days. On Weds            Diagnostic Studies: Dg Chest 2 View  06/30/2011  *  RADIOLOGY REPORT*  Clinical Data: Preop for cervical spine surgery.  Hypertension. Arrhythmia.  Nonsmoker.  CHEST - 2 VIEW  Comparison: None.  Findings: Midline trachea.  Normal heart size and mediastinal contours. No pleural effusion or pneumothorax.  Mild left base scarring.  Right lung clear.  IMPRESSION: No acute cardiopulmonary disease.  Original Report Authenticated By: Consuello Bossier, M.D.   Dg Cervical Spine 1 View  07/10/2011  *RADIOLOGY REPORT*  Clinical Data: Cervical disc disease.  DG CERVICAL SPINE - 1 VIEW  Comparison: None.  Findings: Single lateral view of the cervical spine demonstrates a localization needle at the C6-7 level.  IMPRESSION: Needle at C6-7.  Original Report Authenticated By: Gwynn Burly, M.D.   Dg Cervical Spine 2-3 Views  07/10/2011  *RADIOLOGY REPORT*  Clinical Data: Neck pain  CERVICAL SPINE - 2-3 VIEW  Comparison: None.  Findings: Intraoperative C-arm films demonstrate C5-C7 ACDF with anterior plating.  Anatomic  alignment.  IMPRESSION: As above.  Original Report Authenticated By: Elsie Stain, M.D.    Disposition: Final discharge disposition not confirmed  Discharge Orders    Future Orders Please Complete By Expires   Diet - low sodium heart healthy      Call MD / Call 911      Comments:   If you experience chest pain or shortness of breath, CALL 911 and be transported to the hospital emergency room.  If you develope a fever above 101 F, pus (white drainage) or increased drainage or redness at the wound, or calf pain, call your surgeon's office.   Constipation Prevention      Comments:   Drink plenty of fluids.  Prune juice may be helpful.  You may use a stool softener, such as Colace (over the counter) 100 mg twice a day.  Use MiraLax (over the counter) for constipation as needed.   Increase activity slowly as tolerated      Discharge instructions      Comments:   Wear soft collar at all times.  Keep wound dry and clean.  May change dressing daily or as needed. May sit in bathtub but keep neck dry.  Soft diet if sore throat and advance as tolerated.  Walk as tolerated.  No lifting or overhead activity.   Driving restrictions      Comments:   No driving   Lifting restrictions      Comments:   No lifting      Follow-up Information    Follow up with YATES,MARK C, MD. Schedule an appointment as soon as possible for a visit in 1 week.   Contact information:   [facility] [address] [place] [address] [phone]           Signed: Wende Neighbors 07/11/2011, 12:12 PM

## 2011-07-11 NOTE — Progress Notes (Signed)
Orthopedic Tech Progress Note Patient Details:  Misty Hanson November 02, 1945 [id number]  Patient ID: [name], female   DOB: 06-29-1945, 66 y.o.   MRN: [id number]  Confirmed that patient has cervical collar. Kaius Daino T 07/11/2011, 1:39 PM

## 2011-07-12 ENCOUNTER — Encounter (HOSPITAL_COMMUNITY): Payer: Self-pay | Admitting: Orthopaedic Surgery

## 2013-12-11 ENCOUNTER — Encounter: Payer: Self-pay | Admitting: *Deleted

## 2013-12-16 ENCOUNTER — Ambulatory Visit (INDEPENDENT_AMBULATORY_CARE_PROVIDER_SITE_OTHER): Payer: Medicare Other | Admitting: Cardiology

## 2013-12-16 ENCOUNTER — Encounter: Payer: Self-pay | Admitting: Cardiology

## 2013-12-16 VITALS — BP 180/80 | HR 72 | Ht 62.0 in | Wt 142.0 lb

## 2013-12-16 DIAGNOSIS — R002 Palpitations: Secondary | ICD-10-CM | POA: Insufficient documentation

## 2013-12-16 NOTE — Progress Notes (Signed)
HPI The patient presents for evaluation of palpitations. She has no past cardiac history although she did wear a 24-hour Holter years ago for some palpitations but this was negative. She denies any history of coronary artery disease. She has had palpitations recently when she would be trying to lie down at night.  She was at that time being weaned off of her estrogen patch.  She thought that this might be related so she started back in the palpitations have since gone away. She would notice some as isolated skipped beats. They would make her quite anxious. She has had some increased stress. She denies any chest pressure, neck or arm discomfort. She denies any presyncope or syncope. She has no shortness of breath, PND or orthopnea. She walks stairs frequently and also walks for exercise.  Allergies  Allergen Reactions  . Caffeine Rash  . Cortisone Rash    Current Outpatient Prescriptions  Medication Sig Dispense Refill  . aspirin 81 MG tablet 1 by mouth qd      . estradiol (CLIMARA) 0.06 MG/24HR Place 1 patch onto the skin once a week. On Saturdays      . fish oil-omega-3 fatty acids 1000 MG capsule Take 1 g by mouth daily.      . metoprolol tartrate (LOPRESSOR) 25 MG tablet Take 12.5 mg by mouth 2 (two) times daily.      . Vitamin D, Ergocalciferol, (DRISDOL) 50000 UNITS CAPS Take 50,000 Units by mouth every 7 (seven) days. On Weds       No current facility-administered medications for this visit.    Past Medical History  Diagnosis Date  . PONV (postoperative nausea and vomiting)   . Hypertension     takes Metoprolol daily  . Dysrhythmia     unknown arrythmia  . Hyperlipidemia     takes Fish Oil daily  . IBS (irritable bowel syndrome)   . Arthritis   . Neck pain     HNP  . H/O hiatal hernia   . Diverticulosis     Past Surgical History  Procedure Laterality Date  . Abdominal hysterectomy    . Appendectomy    . Cesarean section    . Colonoscopy    .  Esophagogastroduodenoscopy    . Anterior cervical decomp/discectomy fusion  07/10/2011    Procedure: ANTERIOR CERVICAL DECOMPRESSION/DISCECTOMY FUSION 2 LEVELS;  Surgeon: Marybelle Killings, MD;  Location: [facility];  Service: Orthopedics;  Laterality: N/A;  C5-6, C6-7 Anterior Cervical Discectomy and Fusion, allograft, plate    Family History  Problem Relation Age of Onset  . Anesthesia problems Neg Hx   . Hypotension Neg Hx   . Malignant hyperthermia Neg Hx   . Pseudochol deficiency Neg Hx     History   Social History  . Marital Status: Married    Spouse Name: N/A    Number of Children: N/A  . Years of Education: N/A   Occupational History  . Not on file.   Social History Main Topics  . Smoking status: Never Smoker   . Smokeless tobacco: Not on file  . Alcohol Use: No  . Drug Use: Not on file  . Sexual Activity: Yes    Birth Control/ Protection: Surgical   Other Topics Concern  . Not on file   Social History Narrative  . No narrative on file    ROS:  Positive for reflux, knee pain.  Otherwise as stated in the HPI and negative for all other systems.  PHYSICAL EXAM  BP 180/80  Pulse 72  Ht 5\' 2"  (1.575 m)  Wt 142 lb (64.411 kg)  BMI 25.97 kg/m2 GENERAL:  Well appearing HEENT:  Pupils equal round and reactive, fundi not visualized, oral mucosa unremarkable NECK:  No jugular venous distention, waveform within normal limits, carotid upstroke brisk and symmetric, no bruits, no thyromegaly LYMPHATICS:  No cervical, inguinal adenopathy LUNGS:  Clear to auscultation bilaterally BACK:  No CVA tenderness CHEST:  Unremarkable HEART:  PMI not displaced or sustained,S1 and S2 within normal limits, no S3, no S4, no clicks, no rubs, no murmurs ABD:  Flat, positive bowel sounds normal in frequency in pitch, no bruits, no rebound, no guarding, no midline pulsatile mass, no hepatomegaly, no splenomegaly EXT:  2 plus pulses throughout, no edema, no cyanosis no clubbing SKIN:  No rashes no  nodules NEURO:  Cranial nerves II through XII grossly intact, motor grossly intact throughout PSYCH:  Cognitively intact, oriented to person place and time  EKG:   Sinus rhythm, rate 72, axis within normal limits, intervals within normal limits, poor anterior R wave progression, no acute ST-T wave changes. possible left atrial enlargement.  12/16/2013  ASSESSMENT AND PLAN  PALPITATIONS:  I suspect PACs and PVCs. However, since these are no longer problematic no change in therapy would be suggested. If they come back I would likely put on a 21 day monitor as she says in the past they have been frequent enough to capture with a 24-hour.  Most likely we would manage his symptomatically.  HTN:  She says this is very unusual. She will keep a blood pressure diary. Current treatment suggests 034 systolic as the threshold for initiating therapy. She'll let me know if she's running about this.

## 2013-12-16 NOTE — Patient Instructions (Signed)
Your physician recommends that you schedule a follow-up appointment in:  One year with dr. Percival Spanish

## 2014-12-09 ENCOUNTER — Ambulatory Visit (INDEPENDENT_AMBULATORY_CARE_PROVIDER_SITE_OTHER): Payer: Medicare Other | Admitting: Cardiology

## 2014-12-09 ENCOUNTER — Encounter: Payer: Self-pay | Admitting: Cardiology

## 2014-12-09 VITALS — BP 140/80 | HR 66 | Ht 62.0 in | Wt 144.0 lb

## 2014-12-09 DIAGNOSIS — R002 Palpitations: Secondary | ICD-10-CM | POA: Diagnosis not present

## 2014-12-09 NOTE — Progress Notes (Signed)
   HPI The patient presents for evaluation of palpitations. Since I last saw her her palpitations have been less noticeable and she thinks it's related to being back on aspirin and patch. He occasionally will have some skipped beats but she's not having any symptoms overtly related to this. She denies any chest pressure, neck or arm discomfort. She's not having increasing syncope. She remains activite taking care of  70-year-old twins.  Allergies  Allergen Reactions  . Caffeine Rash  . Cortisone Rash    Current Outpatient Prescriptions  Medication Sig Dispense Refill  . aspirin 81 MG tablet Take 81 mg by mouth daily.     Marland Kitchen estradiol (CLIMARA) 0.06 MG/24HR Place 1 patch onto the skin once a week. On Saturdays    . fish oil-omega-3 fatty acids 1000 MG capsule Take 1 g by mouth daily.    . metoprolol tartrate (LOPRESSOR) 25 MG tablet Take 12.5 mg by mouth 2 (two) times daily.    . Vitamin D, Ergocalciferol, (DRISDOL) 50000 UNITS CAPS Take 50,000 Units by mouth every 7 (seven) days. On Weds     No current facility-administered medications for this visit.    Past Medical History  Diagnosis Date  . PONV (postoperative nausea and vomiting)   . Hypertension     takes Metoprolol daily  . Dysrhythmia     unknown arrythmia  . Hyperlipidemia     takes Fish Oil daily  . IBS (irritable bowel syndrome)   . Arthritis   . Neck pain     HNP  . H/O hiatal hernia   . Diverticulosis     Past Surgical History  Procedure Laterality Date  . Abdominal hysterectomy    . Appendectomy    . Cesarean section    . Colonoscopy    . Esophagogastroduodenoscopy    . Anterior cervical decomp/discectomy fusion  07/10/2011    Procedure: ANTERIOR CERVICAL DECOMPRESSION/DISCECTOMY FUSION 2 LEVELS;  Surgeon: Marybelle Killings, MD;  Location: [facility];  Service: Orthopedics;  Laterality: N/A;  C5-6, C6-7 Anterior Cervical Discectomy and Fusion, allograft, plate    ROS:  As stated in the HPI and negative for all other  systems.  PHYSICAL EXAM BP 140/80 mmHg  Pulse 66  Ht 5\' 2"  (1.575 m)  Wt 144 lb (65.318 kg)  BMI 26.33 kg/m2 GENERAL:  Well appearing NECK:  No jugular venous distention, waveform within normal limits, carotid upstroke brisk and symmetric, no bruits, no thyromegaly LUNGS:  Clear to auscultation bilaterally CHEST:  Unremarkable HEART:  PMI not displaced or sustained,S1 and S2 within normal limits, no S3, no S4, no clicks, no rubs, no murmurs ABD:  Flat, positive bowel sounds normal in frequency in pitch, no bruits, no rebound, no guarding, no midline pulsatile mass, no hepatomegaly, no splenomegaly EXT:  2 plus pulses throughout, no edema, no cyanosis no clubbing   EKG:   Sinus rhythm, rate 66, axis within normal limits, intervals within normal limits, poor anterior R wave progression, no acute ST-T wave changes.  No significant change from previous.  12/09/2014  ASSESSMENT AND PLAN  PALPITATIONS:  These are not particularly symptomatic. Therefore, no change in therapy is indicated. She will remain on the low dose beta blocker.  HTN:  Her blood pressures better controlled today than it was previous visit. I had instructed her to keep a blood pressure diary which she did not do. We talked about this again. No change in therapy is indicated today.

## 2014-12-09 NOTE — Patient Instructions (Signed)
Medication Instructions:  Your physician recommends that you continue on your current medications as directed. Please refer to the Current Medication list given to you today.  Follow-Up: Follow up in 1 year with Dr. Hochrein.  You will receive a letter in the mail 2 months before you are due.  Please call us when you receive this letter to schedule your follow up appointment.   Thank you for choosing [facility]!!       

## 2014-12-16 ENCOUNTER — Encounter: Payer: Self-pay | Admitting: Pulmonary Disease

## 2015-06-01 ENCOUNTER — Encounter (HOSPITAL_COMMUNITY): Payer: Self-pay | Admitting: Emergency Medicine

## 2015-06-01 ENCOUNTER — Observation Stay (HOSPITAL_COMMUNITY)
Admission: EM | Admit: 2015-06-01 | Discharge: 2015-06-02 | Disposition: A | Discharge status: Home / Self Care | Payer: Medicare Other | Attending: Internal Medicine | Admitting: Internal Medicine

## 2015-06-01 ENCOUNTER — Emergency Department (HOSPITAL_COMMUNITY): Payer: Medicare Other

## 2015-06-01 DIAGNOSIS — R55 Syncope and collapse: Secondary | ICD-10-CM | POA: Diagnosis not present

## 2015-06-01 DIAGNOSIS — Z79890 Hormone replacement therapy (postmenopausal): Secondary | ICD-10-CM

## 2015-06-01 DIAGNOSIS — K589 Irritable bowel syndrome without diarrhea: Secondary | ICD-10-CM | POA: Diagnosis not present

## 2015-06-01 DIAGNOSIS — Z7982 Long term (current) use of aspirin: Secondary | ICD-10-CM | POA: Diagnosis not present

## 2015-06-01 DIAGNOSIS — I10 Essential (primary) hypertension: Secondary | ICD-10-CM | POA: Insufficient documentation

## 2015-06-01 DIAGNOSIS — Z79899 Other long term (current) drug therapy: Secondary | ICD-10-CM | POA: Insufficient documentation

## 2015-06-01 DIAGNOSIS — E785 Hyperlipidemia, unspecified: Secondary | ICD-10-CM | POA: Diagnosis present

## 2015-06-01 DIAGNOSIS — M1990 Unspecified osteoarthritis, unspecified site: Secondary | ICD-10-CM | POA: Insufficient documentation

## 2015-06-01 DIAGNOSIS — K5790 Diverticulosis of intestine, part unspecified, without perforation or abscess without bleeding: Secondary | ICD-10-CM | POA: Insufficient documentation

## 2015-06-01 DIAGNOSIS — I499 Cardiac arrhythmia, unspecified: Secondary | ICD-10-CM | POA: Insufficient documentation

## 2015-06-01 LAB — COMPREHENSIVE METABOLIC PANEL
ALT: 19 U/L (ref 14–54)
AST: 23 U/L (ref 15–41)
Albumin: 4 g/dL (ref 3.5–5.0)
Alkaline Phosphatase: 78 U/L (ref 38–126)
Anion gap: 11 (ref 5–15)
BUN: 15 mg/dL (ref 6–20)
CHLORIDE: 102 mmol/L (ref 101–111)
CO2: 26 mmol/L (ref 22–32)
Calcium: 9.5 mg/dL (ref 8.9–10.3)
Creatinine, Ser: 0.69 mg/dL (ref 0.44–1.00)
GFR calc non Af Amer: >60 mL/min (ref >60)
Glucose, Bld: 135 mg/dL — ABNORMAL HIGH (ref 65–99)
Potassium: 3.5 mmol/L (ref 3.5–5.1)
SODIUM: 139 mmol/L (ref 135–145)
Total Bilirubin: 0.6 mg/dL (ref 0.3–1.2)
Total Protein: 7.4 g/dL (ref 6.5–8.1)

## 2015-06-01 LAB — CBC WITH DIFFERENTIAL/PLATELET
Basophils Absolute: 0 10*3/uL (ref 0.0–0.1)
Basophils Relative: 0 %
Eosinophils Absolute: 0.2 10*3/uL (ref 0.0–0.7)
Eosinophils Relative: 2 %
HEMATOCRIT: 42.5 % (ref 36.0–46.0)
HEMOGLOBIN: 14.4 g/dL (ref 12.0–15.0)
LYMPHS ABS: 2.4 10*3/uL (ref 0.7–4.0)
LYMPHS PCT: 32 %
MCH: 30.6 pg (ref 26.0–34.0)
MCHC: 33.9 g/dL (ref 30.0–36.0)
MCV: 90.4 fL (ref 78.0–100.0)
MONOS PCT: 6 %
Monocytes Absolute: 0.5 10*3/uL (ref 0.1–1.0)
NEUTROS PCT: 60 %
Neutro Abs: 4.4 10*3/uL (ref 1.7–7.7)
Platelets: 224 10*3/uL (ref 150–400)
RBC: 4.7 MIL/uL (ref 3.87–5.11)
RDW: 12.8 % (ref 11.5–15.5)
WBC: 7.5 10*3/uL (ref 4.0–10.5)

## 2015-06-01 LAB — TROPONIN I

## 2015-06-01 LAB — CK: Total CK: 63 U/L (ref 38–234)

## 2015-06-01 MED ORDER — METOPROLOL SUCCINATE ER 25 MG PO TB24
12.5000 mg | ORAL_TABLET | Freq: Two times a day (BID) | ORAL | Status: DC
  Administered 2015-06-01 – 2015-06-02 (×2): 12.5 mg via ORAL
  Filled 2015-06-01 (×3): qty 1

## 2015-06-01 MED ORDER — ASPIRIN EC 81 MG PO TBEC
81.0000 mg | DELAYED_RELEASE_TABLET | Freq: Every day | ORAL | Status: DC
Start: 2015-06-02 — End: 2015-06-02
  Administered 2015-06-02: 81 mg via ORAL
  Filled 2015-06-01 (×3): qty 1

## 2015-06-01 MED ORDER — SODIUM CHLORIDE 0.9% FLUSH
3.0000 mL | Freq: Two times a day (BID) | INTRAVENOUS | Status: DC
  Administered 2015-06-01 – 2015-06-02 (×2): 3 mL via INTRAVENOUS

## 2015-06-01 MED ORDER — METOPROLOL SUCCINATE ER 25 MG PO TB24
ORAL_TABLET | ORAL | Status: AC
  Filled 2015-06-01: qty 1

## 2015-06-01 MED ORDER — ONDANSETRON HCL 4 MG/2ML IJ SOLN: 4.0000 mg | Freq: Four times a day (QID) | INTRAMUSCULAR | Status: DC | PRN

## 2015-06-01 MED ORDER — ENOXAPARIN SODIUM 40 MG/0.4ML ~~LOC~~ SOLN
40.0000 mg | SUBCUTANEOUS | Status: DC
  Administered 2015-06-01: 40 mg via SUBCUTANEOUS

## 2015-06-01 MED ORDER — ONDANSETRON HCL 4 MG PO TABS: 4.0000 mg | ORAL_TABLET | Freq: Four times a day (QID) | ORAL | Status: DC | PRN

## 2015-06-01 MED ORDER — ESTRADIOL 0.06 MG/24HR TD PTWK
1.0000 | MEDICATED_PATCH | TRANSDERMAL | Status: DC
  Filled 2015-06-01: qty 1

## 2015-06-01 MED ORDER — ACETAMINOPHEN 650 MG RE SUPP: 650.0000 mg | Freq: Four times a day (QID) | RECTAL | Status: DC | PRN

## 2015-06-01 MED ORDER — PRAVASTATIN SODIUM 40 MG PO TABS
40.0000 mg | ORAL_TABLET | Freq: Every evening | ORAL | Status: DC
  Administered 2015-06-01: 40 mg via ORAL
  Filled 2015-06-01: qty 1

## 2015-06-01 MED ORDER — ACETAMINOPHEN 325 MG PO TABS: 650.0000 mg | ORAL_TABLET | Freq: Four times a day (QID) | ORAL | Status: DC | PRN

## 2015-06-01 NOTE — H&P (Signed)
[facility] History and Physical   Patient: Misty Hanson [id number]   PCP: [name], MD DOB: 1946/01/15   DOA: 06/01/2015   DOS: 06/01/2015   DOS: the patient was seen and examined on 06/01/2015  Referring physician: Dr. Sabra Heck Chief Complaint: Nearly passing out episodes  HPI: Misty Hanson is a 70 y.o. female with Past medical history of hypertension, palpitation, dyslipidemia, hormone replacement therapy. Patient presents with complaints of 2 episodes of nearly passing out events. Patient mentions that she was in her usual state and yesterday at 5:30 PM she felt that her heart was stopping she started having significant trouble breathing and she felt that she was going to pass out and blacked out. She did not blackout and did not have any fall or loss of consciousness and this episode improved that she continues to remain fatigue and tired after that. She had reoccurrence of the similar episode earlier this morning. Therefore she decided to come to the hospital. At the time of my evaluation she complains of some fatigue but no chest pain and abdominal pain no nausea no vomiting or diarrhea. No burning urination. No focal deficit. No vision changes. She mentions she had similar episodes occurring on and off for almost like a year. She denies having had any recent Holter monitor or event monitor or other cardiac workup.  The patient is coming from home.  At her baseline ambulates without support And is independent for most of her ADL; manages her medication on her own.  Review of Systems: as mentioned in the history of present illness.  A comprehensive review of the other systems is negative.  Past Medical History  Diagnosis Date  . PONV (postoperative nausea and vomiting)   . Hypertension     takes Metoprolol daily  . Dysrhythmia     unknown arrythmia  . Hyperlipidemia     takes Fish Oil daily  . IBS (irritable bowel syndrome)   . Arthritis   . Neck pain       HNP  . H/O hiatal hernia   . Diverticulosis    Past Surgical History  Procedure Laterality Date  . Abdominal hysterectomy    . Appendectomy    . Cesarean section    . Colonoscopy    . Esophagogastroduodenoscopy    . Anterior cervical decomp/discectomy fusion  07/10/2011    Procedure: ANTERIOR CERVICAL DECOMPRESSION/DISCECTOMY FUSION 2 LEVELS;  Surgeon: Marybelle Killings, MD;  Location: [facility];  Service: Orthopedics;  Laterality: N/A;  C5-6, C6-7 Anterior Cervical Discectomy and Fusion, allograft, plate   Social History:  reports that she has never smoked. She does not have any smokeless tobacco history on file. She reports that she does not drink alcohol. Her drug history is not on file.  Allergies  Allergen Reactions  . Caffeine Rash    Only in large amounts  . Cortisone Rash    Overdose given to patient of this medication and a rash occurred    Family History  Problem Relation Age of Onset  . Anesthesia problems Neg Hx   . Hypotension Neg Hx   . Malignant hyperthermia Neg Hx   . Pseudochol deficiency Neg Hx   . Cancer Father     Liver  . Heart failure Mother     Prior to Admission medications   Medication Sig Start Date End Date Taking? Authorizing Provider  aspirin 81 MG tablet Take 81 mg by mouth daily.  11/08/07  Yes Historical Provider, MD  estradiol (  CLIMARA) 0.06 MG/24HR Place 1 patch onto the skin once a week. On Saturdays   Yes Historical Provider, MD  fish oil-omega-3 fatty acids 1000 MG capsule Take 1 g by mouth daily.   Yes Historical Provider, MD  metoprolol succinate (TOPROL-XL) 25 MG 24 hr tablet Take 12.5 mg by mouth 2 (two) times daily.   Yes Historical Provider, MD  Multiple Vitamin (MULTIVITAMIN WITH MINERALS) TABS tablet Take 1 tablet by mouth daily.   Yes Historical Provider, MD  naproxen sodium (ALEVE) 220 MG tablet Take 220 mg by mouth once as needed (for pain).   Yes Historical Provider, MD  pravastatin (PRAVACHOL) 40 MG tablet Take 40 mg by mouth every  evening.   Yes Historical Provider, MD  Vitamin D, Ergocalciferol, (DRISDOL) 50000 UNITS CAPS Take 50,000 Units by mouth every 7 (seven) days. On Weds   Yes Historical Provider, MD    Physical Exam: Filed Vitals:   06/01/15 1730 06/01/15 1825 06/01/15 2022 06/01/15 2121  BP: 179/63   144/55  Pulse: 75   80  Temp:    98.7 F (37.1 C)  TempSrc:    Oral  Resp: 15   20  Height:  5\' 2"  (1.575 m)    Weight:  65.454 kg (144 lb 4.8 oz)    SpO2: 100%  99% 100%    General: Alert, Awake and Oriented to Time, Place and Person. Appear in mild distress Eyes: PERRL ENT: Oral Mucosa clear moist. Neck: no JVD Cardiovascular: S1 and S2 Present, no Murmur, Peripheral Pulses Present Respiratory: Bilateral Air entry equal and Decreased,  Clear to Auscultation, no Crackles, no wheezes Abdomen: Bowel Sound present, Soft and no tenderness Skin: no Rash Extremities: no Pedal edema, no calf tenderness Neurologic: Grossly no focal neuro deficit.  Labs on Admission:  CBC:  Recent Labs Lab 06/01/15 1450  WBC 7.5  NEUTROABS 4.4  HGB 14.4  HCT 42.5  MCV 90.4  PLT 224    CMP     Component Value Date/Time   NA 139 06/01/2015 1450   K 3.5 06/01/2015 1450   CL 102 06/01/2015 1450   CO2 26 06/01/2015 1450   GLUCOSE 135* 06/01/2015 1450   BUN 15 06/01/2015 1450   CREATININE 0.69 06/01/2015 1450   CALCIUM 9.5 06/01/2015 1450   PROT 7.4 06/01/2015 1450   ALBUMIN 4.0 06/01/2015 1450   AST 23 06/01/2015 1450   ALT 19 06/01/2015 1450   ALKPHOS 78 06/01/2015 1450   BILITOT 0.6 06/01/2015 1450   GFRNONAA >60 06/01/2015 1450   GFRAA >60 06/01/2015 1450     Recent Labs Lab 06/01/15 1450  CKTOTAL 63  TROPONINI <0.03   BNP (last 3 results) No results for input(s): BNP in the last 8760 hours.  ProBNP (last 3 results) No results for input(s): PROBNP in the last 8760 hours.   Radiological Exams on Admission: Ct Head Wo Contrast  06/01/2015  CLINICAL DATA:  Near syncope today EXAM: CT  HEAD WITHOUT CONTRAST TECHNIQUE: Contiguous axial images were obtained from the base of the skull through the vertex without intravenous contrast. COMPARISON:  None. FINDINGS: No skull fracture is noted. Paranasal sinuses and mastoid air cells are unremarkable. No intracranial hemorrhage, mass effect or midline shift. No acute cortical infarction. No mass lesion is noted on this unenhanced scan. The gray and white-matter differentiation is preserved. IMPRESSION: No acute intracranial abnormality. Electronically Signed   By: Lahoma Crocker M.D.   On: 06/01/2015 16:41   EKG: Independently reviewed. normal  EKG, normal sinus rhythm, unchanged from previous tracings.  Assessment/Plan 1. Near syncope Patient presents with events off near syncope. CT of the head is unremarkable neurological examination is unremarkable she does not appear to be having any valvular murmur. Orthostatically she is negative. Initial blood work is also unremarkable. EKG is nondiagnostic. Although the patient has complaints of shortness of breath with this events. Thus she meets 1 criteria for the [facility] role and she'll be admitted in the hospital for further observation. We'll get echocardiogram in the morning monitor her on telemetry. Orthostatic vital signs daily basis. Continuing with her home medications. If her workup is negative, She may benefit from 30 day cardiac monitor as an outpatient.  2. History of palpitation with hypertension. We will continue her home Toprol-XL.  3. Hormone replacement therapy.  continuing estrogen patch. No evidence of DVT, shortness of breath, PE.  4. Dyslipidemia.  continuing Pravachol.  Nutrition: Cardiac diet DVT Prophylaxis: subcutaneous Heparin  Advance goals of care discussion: Full code   Consults: None  Family Communication: family was present at bedside, opportunity was given to ask question and all questions were answered satisfactorily at the time of  interview. Disposition: Admitted as observation, telemetry unit.  Author: Berle Mull, MD [facility] Pager: [phone] 06/01/2015  If 7PM-7AM, please contact night-coverage [email or web address] Password [email or web address]

## 2015-06-01 NOTE — ED Provider Notes (Signed)
CSN: [id number]     Arrival date & time 06/01/15  1429 History   First MD Initiated Contact with Patient 06/01/15 1529     Chief Complaint  Patient presents with  . Near Syncope     (Consider location/radiation/quality/duration/timing/severity/associated sxs/prior Treatment) HPI Comments: The patient is a 70 year old female, she has a history of hypertension, hyperlipidemia, has had multiple episodes over the past year where she has been feeling syncopal. This started about a year ago when she started to titrate off of her estrogen patch, she had several episodes where she felt like she was going to faint, they put her back on the patch and over the last year she has had no symptoms. In the last 2 days she has had 2 episodes where she had the feeling of like she was going to pass out. Yesterday it happened at 5:30 in the evening when she was sitting at the table, she felt as though her vision was going out like her heart was slowing down as it was going to stop, the symptoms lasted approximately 1 minute and then they resolved. She felt abnormal afterwards but cannot describe this feeling, somewhat like fatigue but had a minimal headache as well. Last night was normal, she woke up this morning and was normal, at approximately 11:00 she developed acute onset of symptoms again, this time while she was doing laundry or she felt there is no she was going to pass out, like her heart was going slow down and stopped. Again it lasted approximately 1 minute and then improved and got back to normal leaving her feeling fatigued. She denies any cardiac history, she has been seen by cardiologist in the past, she was told that her EKG was okay and this was not related to her heart, she does not recall if she did Holter monitoring. At this time the patient has no symptoms other than minimal headache. At no time did the patient states that she felt nauseated, febrile, there is no focal neurologic complaints including  blurred or double vision, weakness or numbness of the arms or the legs or difficulty with balance other than the acute episode making her feel like she was going to pass out. She never lost consciousness, never had seizure activity and denies any other symptoms including rectal bleeding, diarrhea, dysuria or any shortness of breath or coughing.  Patient is a 70 y.o. female presenting with near-syncope. The history is provided by the patient.  Near Syncope    Past Medical History  Diagnosis Date  . PONV (postoperative nausea and vomiting)   . Hypertension     takes Metoprolol daily  . Dysrhythmia     unknown arrythmia  . Hyperlipidemia     takes Fish Oil daily  . IBS (irritable bowel syndrome)   . Arthritis   . Neck pain     HNP  . H/O hiatal hernia   . Diverticulosis    Past Surgical History  Procedure Laterality Date  . Abdominal hysterectomy    . Appendectomy    . Cesarean section    . Colonoscopy    . Esophagogastroduodenoscopy    . Anterior cervical decomp/discectomy fusion  07/10/2011    Procedure: ANTERIOR CERVICAL DECOMPRESSION/DISCECTOMY FUSION 2 LEVELS;  Surgeon: Marybelle Killings, MD;  Location: [facility];  Service: Orthopedics;  Laterality: N/A;  C5-6, C6-7 Anterior Cervical Discectomy and Fusion, allograft, plate   Family History  Problem Relation Age of Onset  . Anesthesia problems Neg Hx   .  Hypotension Neg Hx   . Malignant hyperthermia Neg Hx   . Pseudochol deficiency Neg Hx   . Cancer Father     Liver  . Heart failure Mother    Social History  Substance Use Topics  . Smoking status: Never Smoker   . Smokeless tobacco: None  . Alcohol Use: No   OB History    No data available     Review of Systems  Cardiovascular: Positive for near-syncope.  All other systems reviewed and are negative.     Allergies  Caffeine and Cortisone  Home Medications   Prior to Admission medications   Medication Sig Start Date End Date Taking? Authorizing Provider   aspirin 81 MG tablet Take 81 mg by mouth daily.  11/08/07   Historical Provider, MD  estradiol (CLIMARA) 0.06 MG/24HR Place 1 patch onto the skin once a week. On Saturdays    Historical Provider, MD  fish oil-omega-3 fatty acids 1000 MG capsule Take 1 g by mouth daily.    Historical Provider, MD  metoprolol tartrate (LOPRESSOR) 25 MG tablet Take 12.5 mg by mouth 2 (two) times daily.    Historical Provider, MD  Vitamin D, Ergocalciferol, (DRISDOL) 50000 UNITS CAPS Take 50,000 Units by mouth every 7 (seven) days. On Weds    Historical Provider, MD   BP 172/74 mmHg  Pulse 75  Temp(Src) 98 F (36.7 C) (Oral)  Resp 12  Ht 5\' 2"  (1.575 m)  Wt 145 lb (65.772 kg)  BMI 26.51 kg/m2  SpO2 100% Physical Exam  Constitutional: She appears well-developed and well-nourished. No distress.  HENT:  Head: Normocephalic and atraumatic.  Mouth/Throat: Oropharynx is clear and moist. No oropharyngeal exudate.  Eyes: Conjunctivae and EOM are normal. Pupils are equal, round, and reactive to light. Right eye exhibits no discharge. Left eye exhibits no discharge. No scleral icterus.  Neck: Normal range of motion. Neck supple. No JVD present. No thyromegaly present.  Cardiovascular: Normal rate, regular rhythm, normal heart sounds and intact distal pulses.  Exam reveals no gallop and no friction rub.   No murmur heard. No bruit, no murmur  Pulmonary/Chest: Effort normal and breath sounds normal. No respiratory distress. She has no wheezes. She has no rales.  Abdominal: Soft. Bowel sounds are normal. She exhibits no distension and no mass. There is no tenderness.  Musculoskeletal: Normal range of motion. She exhibits no edema or tenderness.  Lymphadenopathy:    She has no cervical adenopathy.  Neurological: She is alert. Coordination normal.  Neurologic exam:  Speech clear, pupils equal round reactive to light, extraocular movements intact  Normal peripheral visual fields Cranial nerves III through XII normal  including no facial droop Follows commands, moves all extremities x4, normal strength to bilateral upper and lower extremities at all major muscle groups including grip Sensation normal to light touch and pinprick Coordination intact, no limb ataxia, finger-nose-finger normal Rapid alternating movements normal No pronator drift Gait normal   Skin: Skin is warm and dry. No rash noted. No erythema.  Psychiatric: She has a normal mood and affect. Her behavior is normal.  Nursing note and vitals reviewed.   ED Course  Procedures (including critical care time) Labs Review Labs Reviewed  COMPREHENSIVE METABOLIC PANEL - Abnormal; Notable for the following:    Glucose, Bld 135 (*)    All other components within normal limits  CK  TROPONIN I  CBC WITH DIFFERENTIAL/PLATELET    Imaging Review Ct Head Wo Contrast  06/01/2015  CLINICAL DATA:  Near syncope today EXAM: CT HEAD WITHOUT CONTRAST TECHNIQUE: Contiguous axial images were obtained from the base of the skull through the vertex without intravenous contrast. COMPARISON:  None. FINDINGS: No skull fracture is noted. Paranasal sinuses and mastoid air cells are unremarkable. No intracranial hemorrhage, mass effect or midline shift. No acute cortical infarction. No mass lesion is noted on this unenhanced scan. The gray and white-matter differentiation is preserved. IMPRESSION: No acute intracranial abnormality. Electronically Signed   By: Lahoma Crocker M.D.   On: 06/01/2015 16:41   I have personally reviewed and evaluated these images and lab results as part of my medical decision-making.   EKG Interpretation   Date/Time:  Tuesday June 01 2015 14:40:38 EST Ventricular Rate:  75 PR Interval:  156 QRS Duration: 92 QT Interval:  381 QTC Calculation: 425 R Axis:   30 Text Interpretation:  Sinus rhythm Probable left atrial enlargement Low  voltage, precordial leads LVH no longer seen Since last tracing  Nonspecific T wave abnormality,  improved in Confirmed by Iram Astorino  MD, Burney Calzadilla  [phone]) on 06/01/2015 3:34:06 PM      MDM   Final diagnoses:  Near syncope    The patient appears well at this time, she is slightly hypertensive at 170/74, no fever or tachycardia, EKG is unremarkable, labs pending, she has recently started her cholesterol medication in the form of a statin, she has had her estrogen replacement and has not had that changed recently, the way she describes her symptoms makes me concerned for a cardiac source thus I do believe that she will need to have observational admission on cardiac monitoring.  No events noted on monitor,  Check orthostatics, Admit to Dr. Posey Pronto - requests Obs Tele, Holding orders written.  Noemi Chapel, MD 06/01/15 [phone]

## 2015-06-01 NOTE — ED Notes (Signed)
Per EMS patient called EMS to house due to near syncopal episode today. Patient states that she felt light headed and nearly fell down. After episode patient states pounding headache that quickly went away.  Patient denies any symptoms upon arrival to ED.

## 2015-06-01 NOTE — ED Notes (Signed)
MD at bedside. 

## 2015-06-02 ENCOUNTER — Observation Stay (HOSPITAL_BASED_OUTPATIENT_CLINIC_OR_DEPARTMENT_OTHER): Payer: Medicare Other

## 2015-06-02 DIAGNOSIS — E785 Hyperlipidemia, unspecified: Secondary | ICD-10-CM

## 2015-06-02 DIAGNOSIS — Z79890 Hormone replacement therapy (postmenopausal): Secondary | ICD-10-CM | POA: Diagnosis not present

## 2015-06-02 DIAGNOSIS — I10 Essential (primary) hypertension: Secondary | ICD-10-CM | POA: Diagnosis not present

## 2015-06-02 DIAGNOSIS — R55 Syncope and collapse: Secondary | ICD-10-CM

## 2015-06-02 LAB — CBC
HEMATOCRIT: 43.3 % (ref 36.0–46.0)
HEMOGLOBIN: 14 g/dL (ref 12.0–15.0)
MCH: 29.7 pg (ref 26.0–34.0)
MCHC: 32.3 g/dL (ref 30.0–36.0)
MCV: 91.7 fL (ref 78.0–100.0)
Platelets: 229 10*3/uL (ref 150–400)
RBC: 4.72 MIL/uL (ref 3.87–5.11)
RDW: 13 % (ref 11.5–15.5)
WBC: 8.6 10*3/uL (ref 4.0–10.5)

## 2015-06-02 LAB — COMPREHENSIVE METABOLIC PANEL
ALT: 17 U/L (ref 14–54)
ANION GAP: 9 (ref 5–15)
AST: 21 U/L (ref 15–41)
Albumin: 3.6 g/dL (ref 3.5–5.0)
Alkaline Phosphatase: 68 U/L (ref 38–126)
BILIRUBIN TOTAL: 0.6 mg/dL (ref 0.3–1.2)
BUN: 13 mg/dL (ref 6–20)
CALCIUM: 8.8 mg/dL — AB (ref 8.9–10.3)
CO2: 27 mmol/L (ref 22–32)
Chloride: 108 mmol/L (ref 101–111)
Creatinine, Ser: 0.64 mg/dL (ref 0.44–1.00)
GFR calc Af Amer: >60 mL/min (ref >60)
Glucose, Bld: 98 mg/dL (ref 65–99)
POTASSIUM: 3.8 mmol/L (ref 3.5–5.1)
Sodium: 144 mmol/L (ref 135–145)
TOTAL PROTEIN: 6.9 g/dL (ref 6.5–8.1)

## 2015-06-02 LAB — GLUCOSE, CAPILLARY: Glucose-Capillary: 95 mg/dL (ref 65–99)

## 2015-06-02 MED ORDER — PERFLUTREN LIPID MICROSPHERE
1.0000 mL | INTRAVENOUS | Status: DC | PRN
  Administered 2015-06-02: 2 mL via INTRAVENOUS

## 2015-06-02 NOTE — Discharge Summary (Signed)
Physician Discharge Summary  Misty Hanson [id number] DOB: May 01, 1946 DOA: 06/01/2015  PCP: [name], MD  Admit date: 06/01/2015 Discharge date: 06/02/2015  Time spent: 20 minutes  Recommendations for Outpatient Follow-up:  1. Follow up with PCP in 1-2 weeks 2. Recommend outpatient holter monitoring   Discharge Diagnoses:  Principal Problem:   Near syncope Active Problems:   Hypertension   Hyperlipidemia   Hormone replacement therapy (HRT)   Discharge Condition: Stable  Diet recommendation: Heart healthy  Filed Weights   06/01/15 1510 06/01/15 1825 06/02/15 0650  Weight: 65.772 kg (145 lb) 65.454 kg (144 lb 4.8 oz) 65.817 kg (145 lb 1.6 oz)    History of present illness:  Please review dictated H and P from 1/31 for details. Briefly, 70 y.o. female with Past medical history of hypertension, palpitation, dyslipidemia, hormone replacement therapy. Patient presents with complaints of 2 episodes of nearly passing out events  Hospital Course:  1. Near syncope Patient presented with events off near syncope. CT of the head is unremarkable. Neurological examination is unremarkable. Orthostatics negative. Initial blood work is also unremarkable. EKG is nondiagnostic. 2D echocardiogram done on 2/1 was unremarkable. Continued with her home medications. Pt may benefit from 30 day cardiac monitor as an outpatient. Have called Cardiology who will schedule as outpatient  2. History of palpitation with hypertension. Continued her home Toprol-XL.  3. Hormone replacement therapy.  Continued estrogen patch. No evidence of DVT, shortness of breath, PE.  4. Dyslipidemia.  Continued Pravachol.  Discharge Exam: Filed Vitals:   06/02/15 0659 06/02/15 0740 06/02/15 1021 06/02/15 1032  BP: 123/51 157/62 170/63 138/74  Pulse: 67 65 65 63  Temp: 97.6 F (36.4 C) 98.5 F (36.9 C) 98 F (36.7 C) 97.8 F (36.6 C)  TempSrc: Oral Oral  Oral  Resp: 20 20 20 20   Height:       Weight:      SpO2: 98% 99% 100%     General: Awake, in nad Cardiovascular: regular, s1, s2 Respiratory: normal resp effort, no wheezing  Discharge Instructions     Medication List    ASK your doctor about these medications        ALEVE 220 MG tablet  Generic drug:  naproxen sodium  Take 220 mg by mouth once as needed (for pain).     aspirin 81 MG tablet  Take 81 mg by mouth daily.     estradiol 0.06 MG/24HR  Commonly known as:  CLIMARA  Place 1 patch onto the skin once a week. On Saturdays     fish oil-omega-3 fatty acids 1000 MG capsule  Take 1 g by mouth daily.     metoprolol succinate 25 MG 24 hr tablet  Commonly known as:  TOPROL-XL  Take 12.5 mg by mouth 2 (two) times daily.     multivitamin with minerals Tabs tablet  Take 1 tablet by mouth daily.     pravastatin 40 MG tablet  Commonly known as:  PRAVACHOL  Take 40 mg by mouth every evening.     Vitamin D (Ergocalciferol) 50000 units Caps capsule  Commonly known as:  DRISDOL  Take 50,000 Units by mouth every 7 (seven) days. On Weds       Allergies  Allergen Reactions  . Caffeine Rash    Only in large amounts  . Cortisone Rash    Overdose given to patient of this medication and a rash occurred      The results of significant diagnostics from this hospitalization (  including imaging, microbiology, ancillary and laboratory) are listed below for reference.    Significant Diagnostic Studies: Ct Head Wo Contrast  06/01/2015  CLINICAL DATA:  Near syncope today EXAM: CT HEAD WITHOUT CONTRAST TECHNIQUE: Contiguous axial images were obtained from the base of the skull through the vertex without intravenous contrast. COMPARISON:  None. FINDINGS: No skull fracture is noted. Paranasal sinuses and mastoid air cells are unremarkable. No intracranial hemorrhage, mass effect or midline shift. No acute cortical infarction. No mass lesion is noted on this unenhanced scan. The gray and white-matter differentiation is  preserved. IMPRESSION: No acute intracranial abnormality. Electronically Signed   By: Lahoma Crocker M.D.   On: 06/01/2015 16:41    Microbiology: No results found for this or any previous visit (from the past 240 hour(s)).   Labs: Basic Metabolic Panel:  Recent Labs Lab 06/01/15 1450 06/02/15 0640  NA 139 144  K 3.5 3.8  CL 102 108  CO2 26 27  GLUCOSE 135* 98  BUN 15 13  CREATININE 0.69 0.64  CALCIUM 9.5 8.8*   Liver Function Tests:  Recent Labs Lab 06/01/15 1450 06/02/15 0640  AST 23 21  ALT 19 17  ALKPHOS 78 68  BILITOT 0.6 0.6  PROT 7.4 6.9  ALBUMIN 4.0 3.6   No results for input(s): LIPASE, AMYLASE in the last 168 hours. No results for input(s): AMMONIA in the last 168 hours. CBC:  Recent Labs Lab 06/01/15 1450 06/02/15 0640  WBC 7.5 8.6  NEUTROABS 4.4  --   HGB 14.4 14.0  HCT 42.5 43.3  MCV 90.4 91.7  PLT 224 229   Cardiac Enzymes:  Recent Labs Lab 06/01/15 1450  CKTOTAL 44  TROPONINI <0.03   BNP: BNP (last 3 results) No results for input(s): BNP in the last 8760 hours.  ProBNP (last 3 results) No results for input(s): PROBNP in the last 8760 hours.  CBG:  Recent Labs Lab 06/02/15 0740  GLUCAP 95    Signed:  Gearld Kerstein K  [facility] 06/02/2015, 12:27 PM

## 2015-06-02 NOTE — Progress Notes (Signed)
Patient states understanding of discharge instructions.  

## 2015-09-04 ENCOUNTER — Emergency Department (HOSPITAL_COMMUNITY)
Admission: EM | Admit: 2015-09-04 | Discharge: 2015-09-04 | Disposition: A | Discharge status: Home / Self Care | Payer: Medicare Other | Attending: Emergency Medicine | Admitting: Emergency Medicine

## 2015-09-04 ENCOUNTER — Encounter (HOSPITAL_COMMUNITY): Payer: Self-pay | Admitting: Emergency Medicine

## 2015-09-04 ENCOUNTER — Emergency Department (HOSPITAL_COMMUNITY): Payer: Medicare Other

## 2015-09-04 DIAGNOSIS — I10 Essential (primary) hypertension: Secondary | ICD-10-CM | POA: Insufficient documentation

## 2015-09-04 DIAGNOSIS — Z7982 Long term (current) use of aspirin: Secondary | ICD-10-CM | POA: Insufficient documentation

## 2015-09-04 DIAGNOSIS — E785 Hyperlipidemia, unspecified: Secondary | ICD-10-CM | POA: Diagnosis not present

## 2015-09-04 DIAGNOSIS — L03012 Cellulitis of left finger: Secondary | ICD-10-CM

## 2015-09-04 DIAGNOSIS — M79675 Pain in left toe(s): Secondary | ICD-10-CM | POA: Diagnosis present

## 2015-09-04 DIAGNOSIS — L03032 Cellulitis of left toe: Secondary | ICD-10-CM | POA: Insufficient documentation

## 2015-09-04 DIAGNOSIS — M1990 Unspecified osteoarthritis, unspecified site: Secondary | ICD-10-CM | POA: Diagnosis not present

## 2015-09-04 DIAGNOSIS — Z79899 Other long term (current) drug therapy: Secondary | ICD-10-CM | POA: Diagnosis not present

## 2015-09-04 MED ORDER — CEPHALEXIN 500 MG PO CAPS
500.0000 mg | ORAL_CAPSULE | Freq: Once | ORAL | Status: AC
  Administered 2015-09-04: 500 mg via ORAL
  Filled 2015-09-04: qty 1

## 2015-09-04 MED ORDER — IBUPROFEN 400 MG PO TABS: 400.0000 mg | ORAL_TABLET | Freq: Three times a day (TID) | ORAL | Status: AC

## 2015-09-04 MED ORDER — HYDROCODONE-ACETAMINOPHEN 5-325 MG PO TABS: 1.0000 | ORAL_TABLET | ORAL | Status: DC | PRN

## 2015-09-04 MED ORDER — CEPHALEXIN 500 MG PO CAPS: 500.0000 mg | ORAL_CAPSULE | Freq: Three times a day (TID) | ORAL | Status: DC

## 2015-09-04 NOTE — ED Notes (Signed)
Pt states her left great toe has become reddened and swollen and very painful at the nail base over the past few days.  Denies injury.

## 2015-09-04 NOTE — ED Provider Notes (Signed)
CSN: [id number]     Arrival date & time 09/04/15  1227 History   First MD Initiated Contact with Patient 09/04/15 1307     Chief Complaint  Patient presents with  . Toe Pain     (Consider location/radiation/quality/duration/timing/severity/associated sxs/prior Treatment) Patient is a 70 y.o. female presenting with toe pain. The history is provided by the patient and a significant other.  Toe Pain This is a new problem. The current episode started more than 2 days ago. The problem occurs constantly. The problem has been gradually worsening. Pertinent negatives include no shortness of breath. Nothing aggravates the symptoms. Nothing relieves the symptoms. She has tried nothing for the symptoms. The treatment provided no relief.    Past Medical History  Diagnosis Date  . PONV (postoperative nausea and vomiting)   . Hypertension     takes Metoprolol daily  . Dysrhythmia     unknown arrythmia  . Hyperlipidemia     takes Fish Oil daily  . IBS (irritable bowel syndrome)   . Arthritis   . Neck pain     HNP  . H/O hiatal hernia   . Diverticulosis    Past Surgical History  Procedure Laterality Date  . Abdominal hysterectomy    . Appendectomy    . Cesarean section    . Colonoscopy    . Esophagogastroduodenoscopy    . Anterior cervical decomp/discectomy fusion  07/10/2011    Procedure: ANTERIOR CERVICAL DECOMPRESSION/DISCECTOMY FUSION 2 LEVELS;  Surgeon: Marybelle Killings, MD;  Location: [facility];  Service: Orthopedics;  Laterality: N/A;  C5-6, C6-7 Anterior Cervical Discectomy and Fusion, allograft, plate   Family History  Problem Relation Age of Onset  . Anesthesia problems Neg Hx   . Hypotension Neg Hx   . Malignant hyperthermia Neg Hx   . Pseudochol deficiency Neg Hx   . Cancer Father     Liver  . Heart failure Mother    Social History  Substance Use Topics  . Smoking status: Never Smoker   . Smokeless tobacco: None  . Alcohol Use: No   OB History    No data available      Review of Systems  Constitutional: Negative for fever and chills.  Eyes: Negative for pain.  Respiratory: Negative for cough and shortness of breath.   Endocrine: Negative for polydipsia and polyuria.  Genitourinary: Negative for dysuria and enuresis.  Musculoskeletal:       Left toe pain  All other systems reviewed and are negative.     Allergies  Caffeine and Cortisone  Home Medications   Prior to Admission medications   Medication Sig Start Date End Date Taking? Authorizing Provider  aspirin 81 MG tablet Take 81 mg by mouth daily.  11/08/07  Yes Historical Provider, MD  estradiol (CLIMARA) 0.06 MG/24HR Place 1 patch onto the skin once a week. On Saturdays   Yes Historical Provider, MD  fish oil-omega-3 fatty acids 1000 MG capsule Take 1 g by mouth daily.   Yes Historical Provider, MD  metoprolol succinate (TOPROL-XL) 25 MG 24 hr tablet Take 12.5 mg by mouth 2 (two) times daily.   Yes Historical Provider, MD  Multiple Vitamin (MULTIVITAMIN WITH MINERALS) TABS tablet Take 1 tablet by mouth daily.   Yes Historical Provider, MD  naproxen sodium (ALEVE) 220 MG tablet Take 220 mg by mouth once as needed (for pain).   Yes Historical Provider, MD  pravastatin (PRAVACHOL) 40 MG tablet Take 40 mg by mouth every evening.   Yes  Historical Provider, MD  Vitamin D, Ergocalciferol, (DRISDOL) 50000 UNITS CAPS Take 50,000 Units by mouth every 7 (seven) days. On Weds   Yes Historical Provider, MD  cephALEXin (KEFLEX) 500 MG capsule Take 1 capsule (500 mg total) by mouth 3 (three) times daily. 09/04/15   Merrily Pew, MD  HYDROcodone-acetaminophen (NORCO) 5-325 MG tablet Take 1 tablet by mouth every 4 (four) hours as needed. 09/04/15   Merrily Pew, MD  ibuprofen (ADVIL,MOTRIN) 400 MG tablet Take 1 tablet (400 mg total) by mouth 3 (three) times daily. 09/04/15 09/11/15  Corene Cornea Janeva Peaster, MD   BP 192/66 mmHg  Pulse 71  Temp(Src) 97.4 F (36.3 C) (Oral)  Resp 18  Ht 5\' 2"  (1.575 m)  Wt 146 lb (66.225 kg)   BMI 26.70 kg/m2  SpO2 100% Physical Exam  Constitutional: She is oriented to person, place, and time. She appears well-developed and well-nourished.  HENT:  Head: Normocephalic and atraumatic.  Neck: Normal range of motion.  Cardiovascular: Normal rate and regular rhythm.   Pulmonary/Chest: No stridor. No respiratory distress.  Abdominal: Soft. She exhibits no distension. There is no tenderness.  Musculoskeletal: Normal range of motion.  Neurological: She is alert and oriented to person, place, and time. No cranial nerve deficit. Coordination normal.  Skin: Rash (right toe near the cuticle with swelling, ttp, fluctuance) noted.  Nursing note and vitals reviewed.   ED Course  .Marland KitchenIncision and Drainage Date/Time: 09/04/2015 1:46 PM Performed by: Merrily Pew Authorized by: Merrily Pew Consent: Verbal consent obtained. Risks and benefits: risks, benefits and alternatives were discussed Consent given by: patient Patient understanding: patient does not state understanding of the procedure being performed Patient consent: the patient's understanding of the procedure does not match consent given Required items: required blood products, implants, devices, and special equipment available Indications for incision and drainage: paronychia. Body area: lower extremity Location details: left big toe Patient sedated: no Needle gauge: 18 Incision type: single straight Complexity: simple Drainage: purulent Drainage amount: moderate Wound treatment: wound left open Patient tolerance: Patient tolerated the procedure well with no immediate complications   (including critical care time) Labs Review Labs Reviewed - No data to display  Imaging Review Dg Foot Complete Left  09/04/2015  CLINICAL DATA:  Infected left great toe.  Initial encounter. EXAM: LEFT FOOT - COMPLETE 3+ VIEW COMPARISON:  None. FINDINGS: Hallux valgus deformity. First MTP joint degenerative changes. Normal an attention no  evidence for acute fracture dislocation. Regional soft tissues are unremarkable. No evidence of osseous cortical destruction involving the great toe. IMPRESSION: No acute osseous abnormality. Electronically Signed   By: Lovey Newcomer M.D.   On: 09/04/2015 13:15   I have personally reviewed and evaluated these images and lab results as part of my medical decision-making.   EKG Interpretation None      MDM   Final diagnoses:  Paronychia, left    Paronychia, drained as per procedure note above. Some surrounding cellulitis, will advise keflex, epsom salt soaks, close PCP follow up .   New Prescriptions: New Prescriptions   CEPHALEXIN (KEFLEX) 500 MG CAPSULE    Take 1 capsule (500 mg total) by mouth 3 (three) times daily.   HYDROCODONE-ACETAMINOPHEN (NORCO) 5-325 MG TABLET    Take 1 tablet by mouth every 4 (four) hours as needed.   IBUPROFEN (ADVIL,MOTRIN) 400 MG TABLET    Take 1 tablet (400 mg total) by mouth 3 (three) times daily.     I have personally and contemperaneously reviewed labs and imaging  and used in my decision making as above.   A medical screening exam was performed and I feel the patient has had an appropriate workup for their chief complaint at this time and likelihood of emergent condition existing is low and thus workup can continue on an outpatient basis.. Their vital signs are stable. They have been counseled on decision, discharge, follow up and which symptoms necessitate immediate return to the emergency department.  They verbally stated understanding and agreement with plan and discharged in stable condition.      Merrily Pew, MD 09/04/15 [phone]

## 2015-09-04 NOTE — Discharge Instructions (Signed)
Please do epsom salt soaks 2-3 times a day until redness and swelling clear.

## 2015-09-04 NOTE — ED Notes (Signed)
Wound dressed. Post op shoe provided. Verbalized understanding of dc instructions, prescriptions and follow up care.

## 2015-12-23 ENCOUNTER — Encounter: Payer: Self-pay | Admitting: Cardiology

## 2016-01-03 NOTE — Progress Notes (Signed)
HPI The patient presents for evaluation of palpitations. Since I last saw her her she was in the hospital in May with near syncope.  I reviewed these records.  She was not orthostatic and her head CT was negative.   She was going to get an event recorder but this never happened. Since then she's had no further episodes. She's taking care of her husband who has cancer. She's under stress because of this. The patient denies any new symptoms such as chest discomfort, neck or arm discomfort. There has been no new shortness of breath, PND or orthopnea. There has been no reported  presyncope or syncope.   Allergies  Allergen Reactions  . Cortisone Rash    Overdose given to patient of this medication and a rash occurred  . Prednisone Rash  . Caffeine Rash    Only in large amounts    Current Outpatient Prescriptions  Medication Sig Dispense Refill  . aspirin 81 MG tablet Take 81 mg by mouth daily.     Marland Kitchen estradiol (CLIMARA) 0.06 MG/24HR Place 1 patch onto the skin once a week. On Saturdays    . fish oil-omega-3 fatty acids 1000 MG capsule Take 1 g by mouth daily.    . metoprolol succinate (TOPROL-XL) 25 MG 24 hr tablet Take one-half tablet by  mouth two times daily    . naproxen sodium (ALEVE) 220 MG tablet Take 220 mg by mouth once as needed (for pain).    . pravastatin (PRAVACHOL) 40 MG tablet Take 40 mg by mouth every evening.    . Vitamin D, Ergocalciferol, (DRISDOL) 50000 UNITS CAPS Take 50,000 Units by mouth every 7 (seven) days. On Weds     No current facility-administered medications for this visit.     Past Medical History:  Diagnosis Date  . Arthritis   . Diverticulosis   . Dysrhythmia    unknown arrythmia  . H/O hiatal hernia   . Hyperlipidemia    takes Fish Oil daily  . Hypertension    takes Metoprolol daily  . IBS (irritable bowel syndrome)   . Neck pain    HNP  . PONV (postoperative nausea and vomiting)     Past Surgical History:  Procedure Laterality Date  .  ABDOMINAL HYSTERECTOMY    . ANTERIOR CERVICAL DECOMP/DISCECTOMY FUSION  07/10/2011   Procedure: ANTERIOR CERVICAL DECOMPRESSION/DISCECTOMY FUSION 2 LEVELS;  Surgeon: Marybelle Killings, MD;  Location: [facility];  Service: Orthopedics;  Laterality: N/A;  C5-6, C6-7 Anterior Cervical Discectomy and Fusion, allograft, plate  . APPENDECTOMY    . CESAREAN SECTION    . COLONOSCOPY    . ESOPHAGOGASTRODUODENOSCOPY      ROS:  As stated in the HPI and negative for all other systems.  PHYSICAL EXAM BP (!) 162/78   Pulse 72   Ht 5\' 2"  (1.575 m)   Wt 147 lb (66.7 kg)   BMI 26.89 kg/m  GENERAL:  Well appearing NECK:  No jugular venous distention, waveform within normal limits, carotid upstroke brisk and symmetric, no bruits, no thyromegaly LUNGS:  Clear to auscultation bilaterally CHEST:  Unremarkable HEART:  PMI not displaced or sustained,S1 and S2 within normal limits, no S3, no S4, no clicks, no rubs, no murmurs ABD:  Flat, positive bowel sounds normal in frequency in pitch, no bruits, no rebound, no guarding, no midline pulsatile mass, no hepatomegaly, no splenomegaly EXT:  2 plus pulses throughout, no edema, no cyanosis no clubbing  EKG:   Sinus rhythm, rate 73,  axis within normal limits, intervals within normal limits, poor anterior R wave progression, no acute ST-T wave changes.  No significant change from previous.  01/05/2016  ASSESSMENT AND PLAN  NEAR SYNCOPE:   Hospital records reviewed.  She's not had any further episodes. Therefore, I don't think event monitoring helpful. No change in therapy is indicated. They told her this could've been anxiety which certainly could be related to her situation and I asked her to talk to [name], MD about this.  PALPITATIONS:  These are not particularly symptomatic. Therefore, no change in therapy is indicated. She will remain on the low dose beta blocker.  HTN:  Her blood pressures is slightly elevated which is unusual. She will keep a blood  pressure diary. She is on a beta blocker and might adjust this as needed.

## 2016-01-05 ENCOUNTER — Ambulatory Visit (INDEPENDENT_AMBULATORY_CARE_PROVIDER_SITE_OTHER): Payer: Medicare Other | Admitting: Cardiology

## 2016-01-05 ENCOUNTER — Encounter: Payer: Self-pay | Admitting: Cardiology

## 2016-01-05 VITALS — BP 162/78 | HR 72 | Ht 62.0 in | Wt 147.0 lb

## 2016-01-05 DIAGNOSIS — I10 Essential (primary) hypertension: Secondary | ICD-10-CM

## 2016-01-05 DIAGNOSIS — R002 Palpitations: Secondary | ICD-10-CM

## 2016-01-05 NOTE — Patient Instructions (Signed)

## 2016-07-10 IMAGING — CT CT HEAD W/O CM
1 series · 16 of 30 positions shown, 20 images · non-contrast
Comparison: None.

CLINICAL DATA: Near syncope today

EXAM:
CT HEAD WITHOUT CONTRAST
TECHNIQUE: Contiguous axial images were obtained from the base of the skull
through the vertex without intravenous contrast.

[Series 2: headseq 4.8 h37s · axial · 0.42mm/px · z∈[+126,+262]mm · 16 of 30 slices shown, 20 images]
[im 2/30  brain]
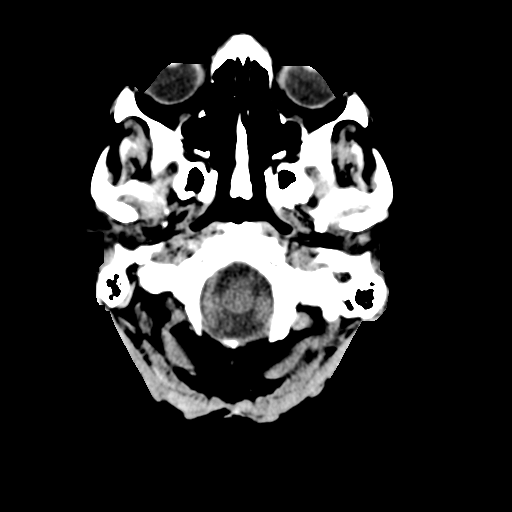
[im 2/30  bone]
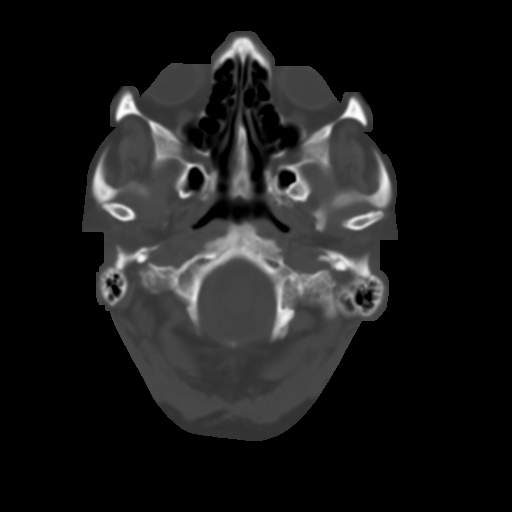
[im 4/30  brain]
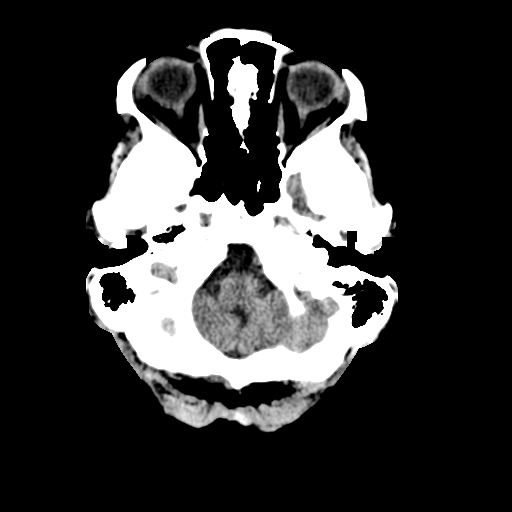
[im 6/30  brain]
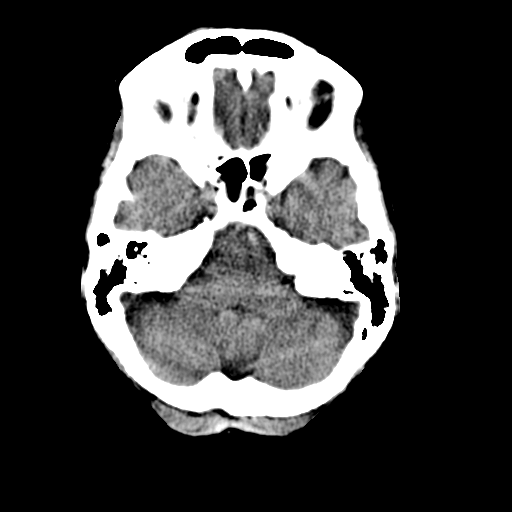
[im 8/30  brain]
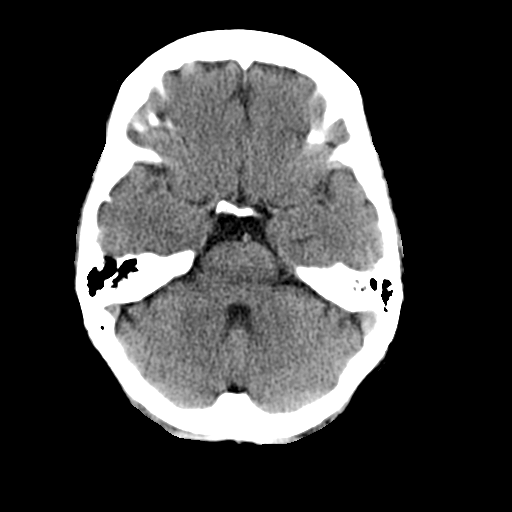
[im 9/30  brain]
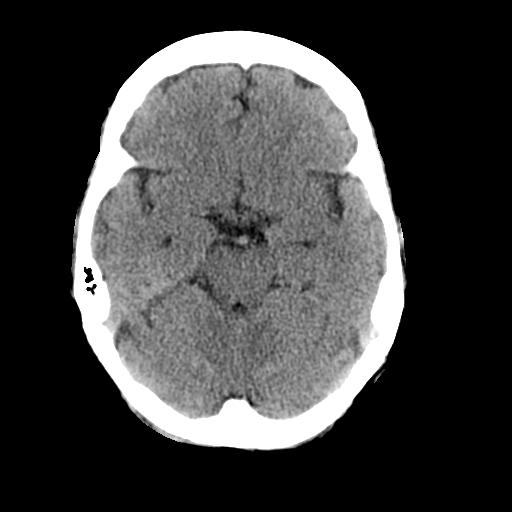
[im 9/30  bone]
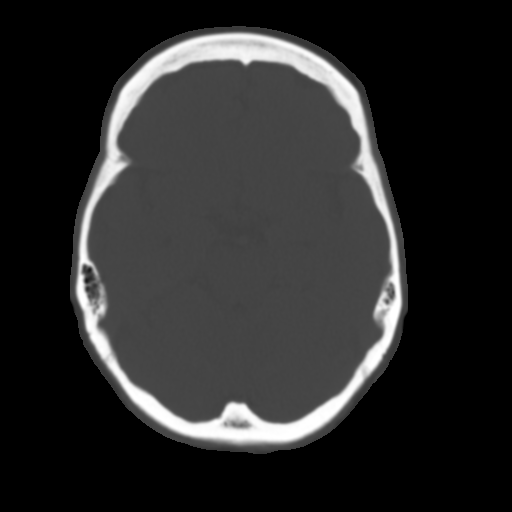
[im 11/30  brain]
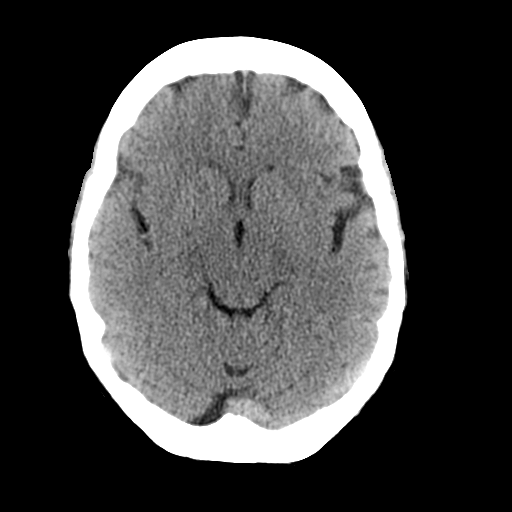
[im 13/30  brain]
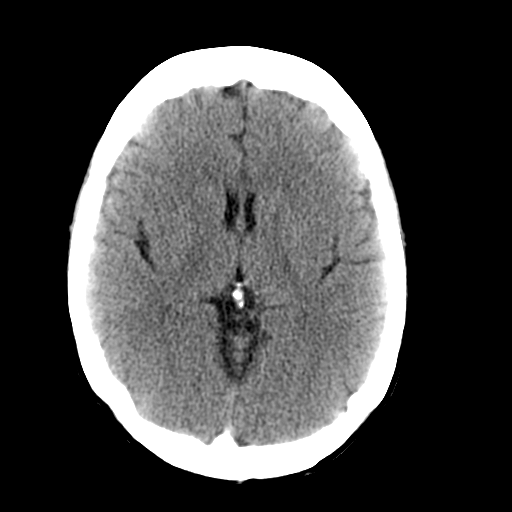
[im 15/30  brain]
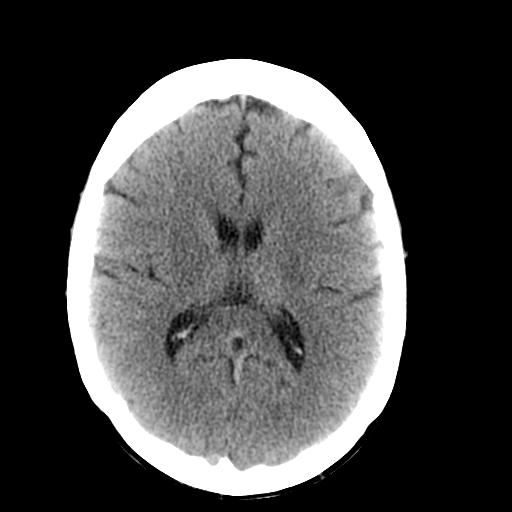
[im 16/30  brain]
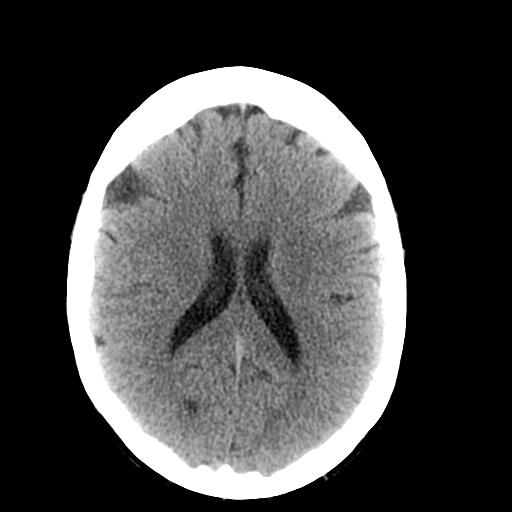
[im 16/30  bone]
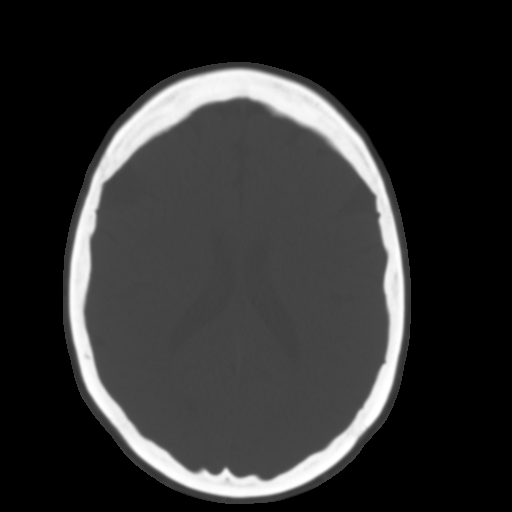
[im 18/30  brain]
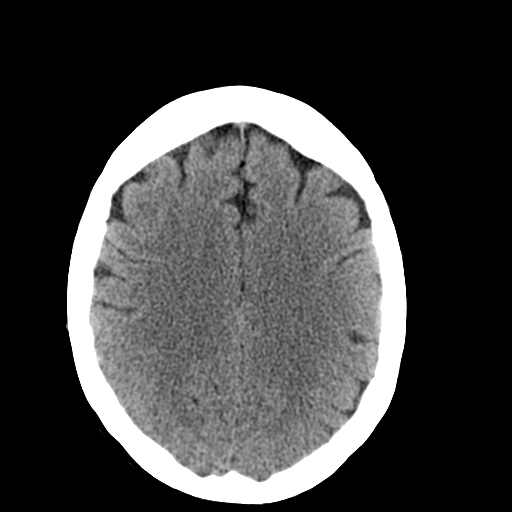
[im 20/30  brain]
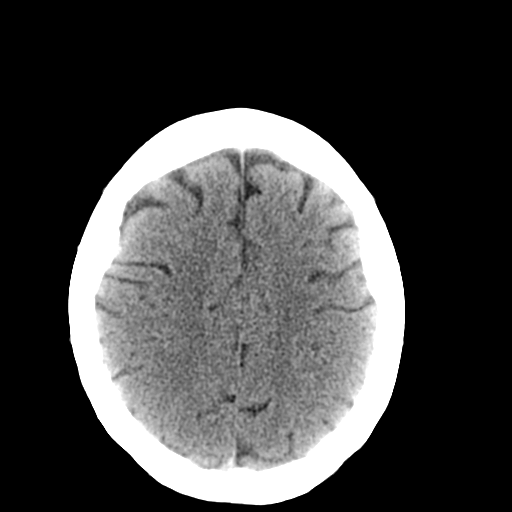
[im 22/30  brain]
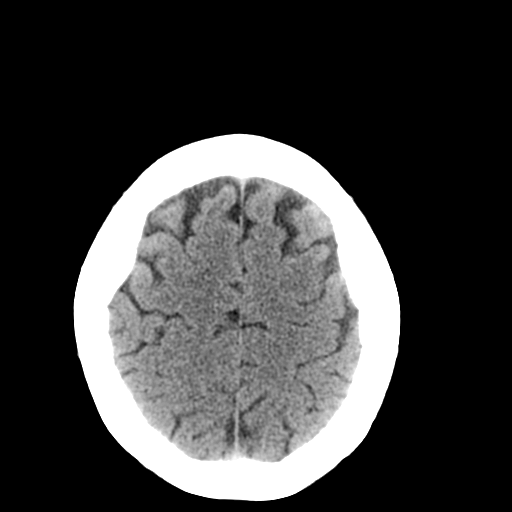
[im 23/30  brain]
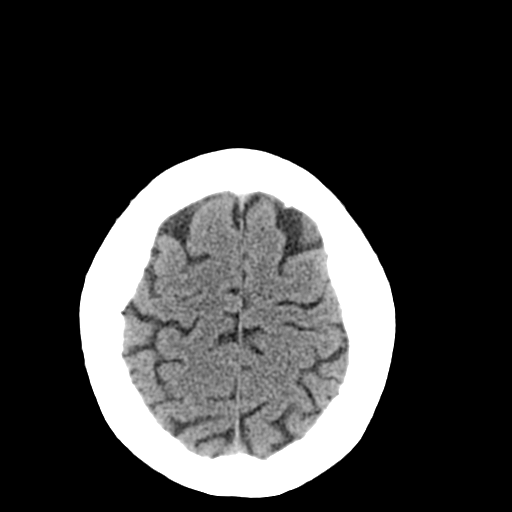
[im 23/30  bone]
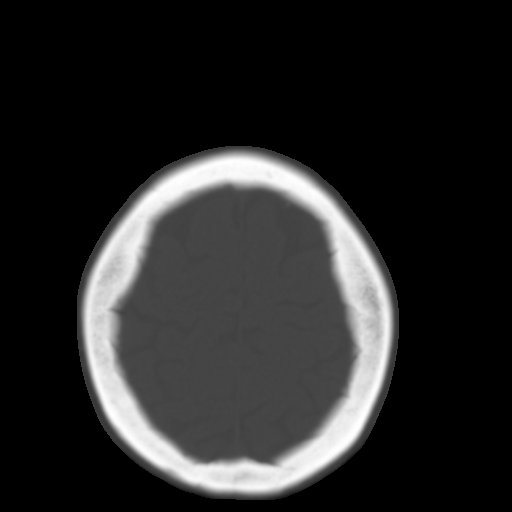
[im 25/30  brain]
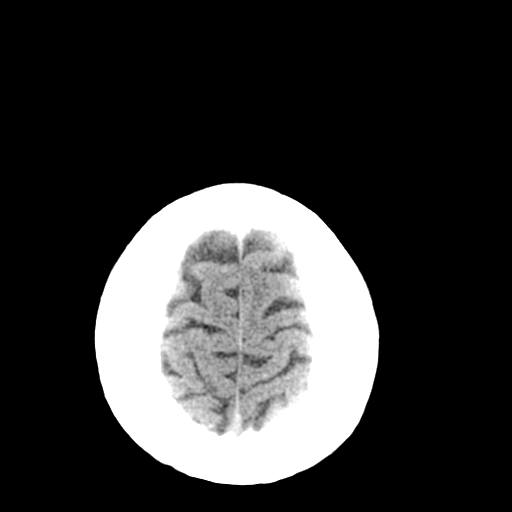
[im 27/30  brain]
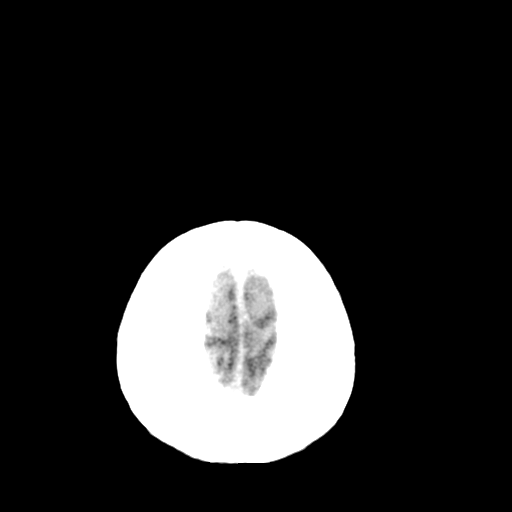
[im 29/30  brain]
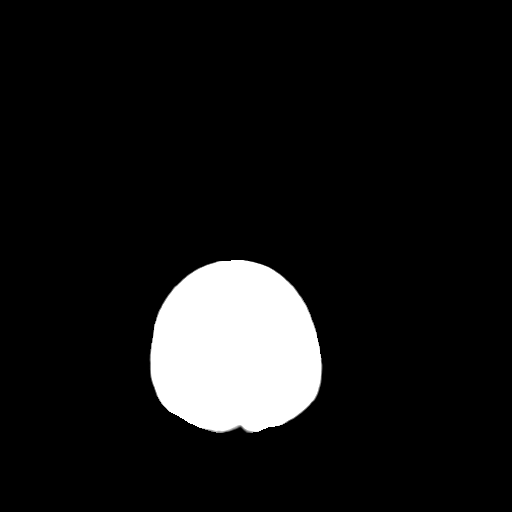

[16 of 30 positions shown; findings below may reference images not displayed]

FINDINGS: No skull fracture is noted. Paranasal sinuses and mastoid air cells
are unremarkable. No intracranial hemorrhage, mass effect or midline
shift.

No acute cortical infarction. No mass lesion is noted on this
unenhanced scan. The gray and white-matter differentiation is
preserved.
IMPRESSION: No acute intracranial abnormality.

## 2016-10-13 IMAGING — CR DG FOOT COMPLETE 3+V*L*
1 series · 6 of 6 positions shown · non-contrast
Comparison: None.

CLINICAL DATA: Infected left great toe.  Initial encounter.

EXAM:
LEFT FOOT - COMPLETE 3+ VIEW

[Series 2: ap · 0.17mm/px · 6 of 6 slices shown]
[im 1/6]
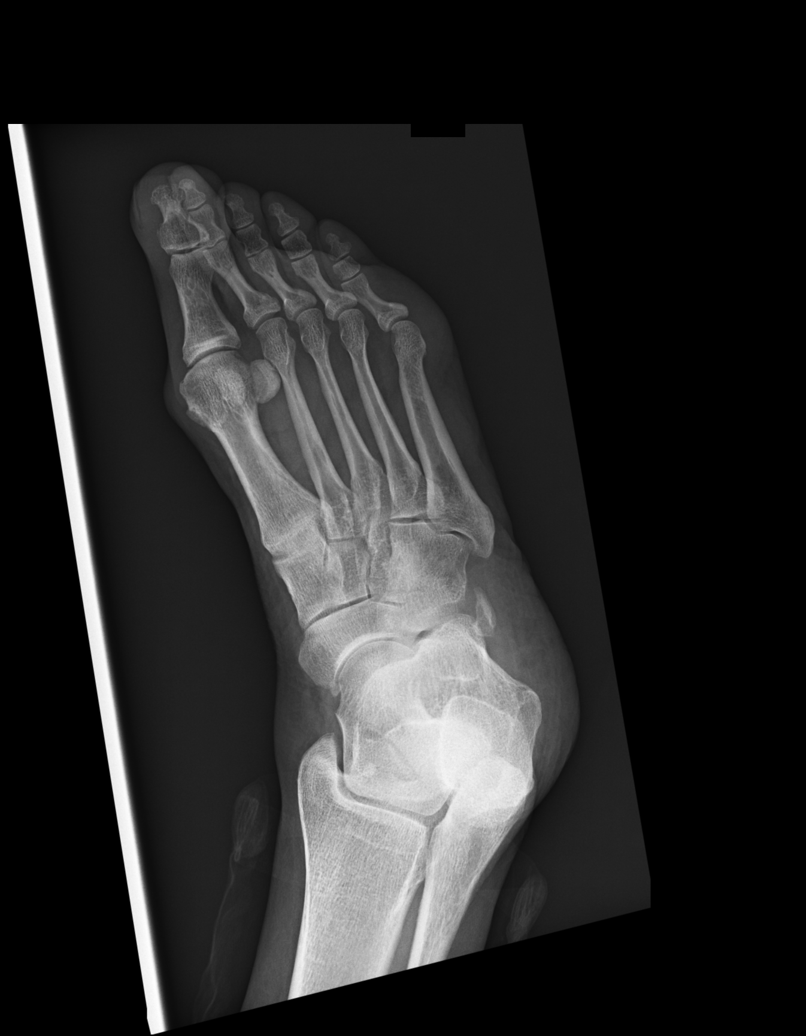
[im 2/6]
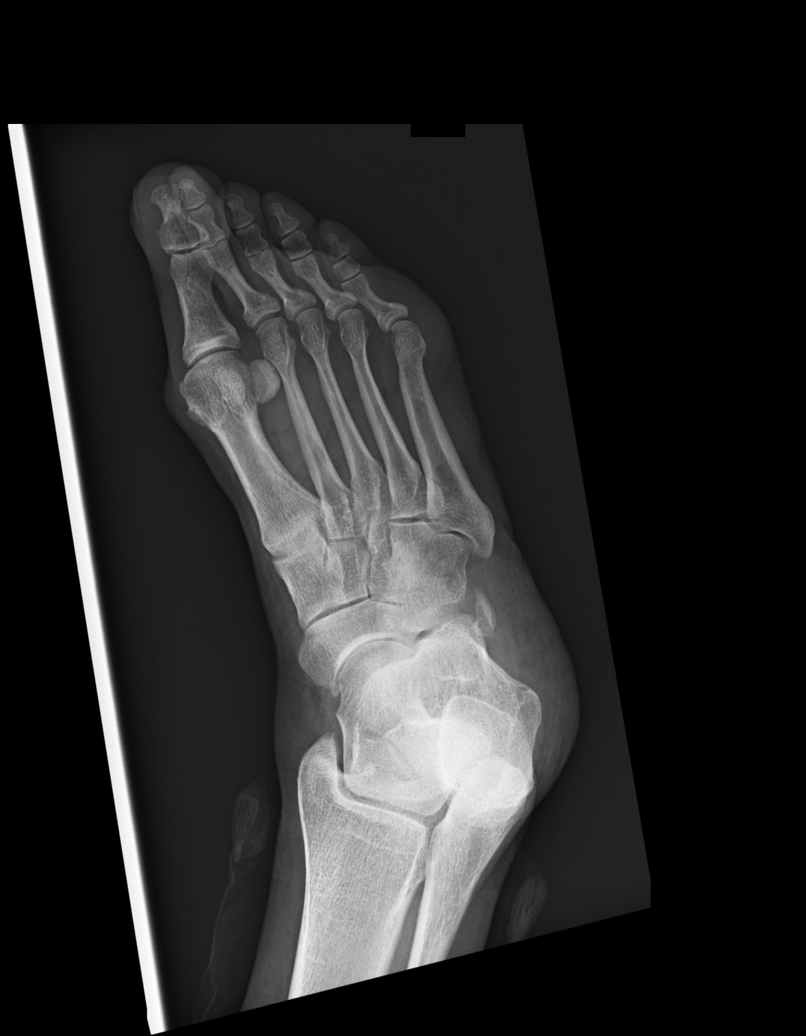
[im 3/6]
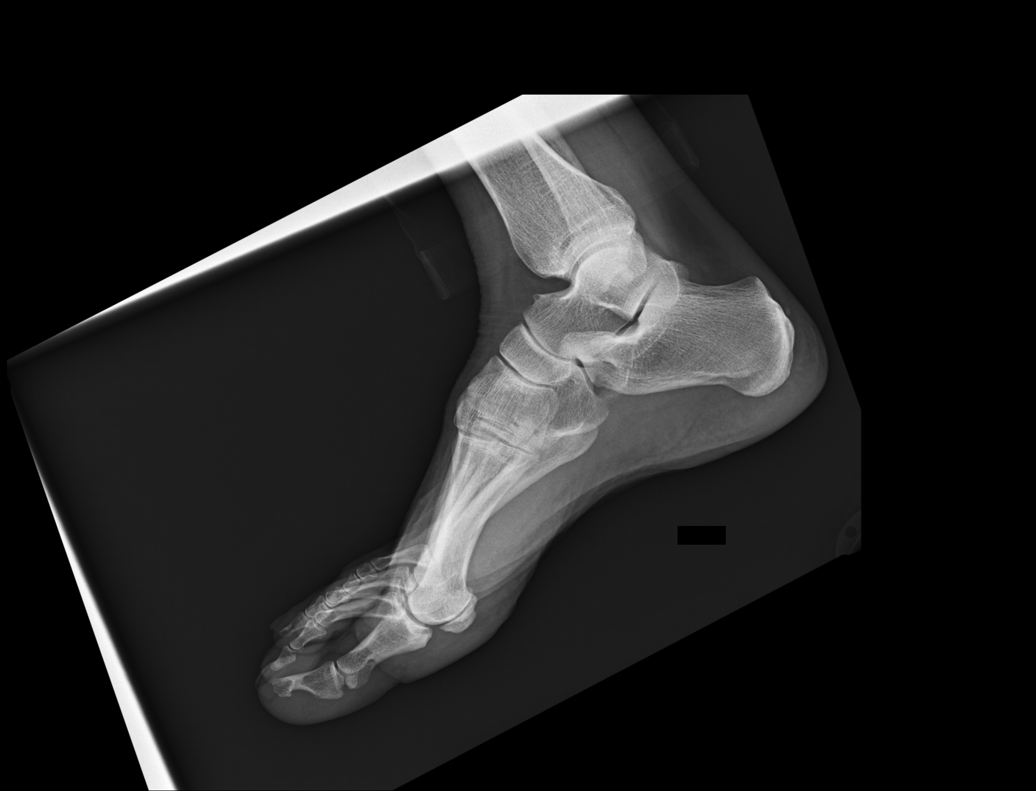
[im 4/6]
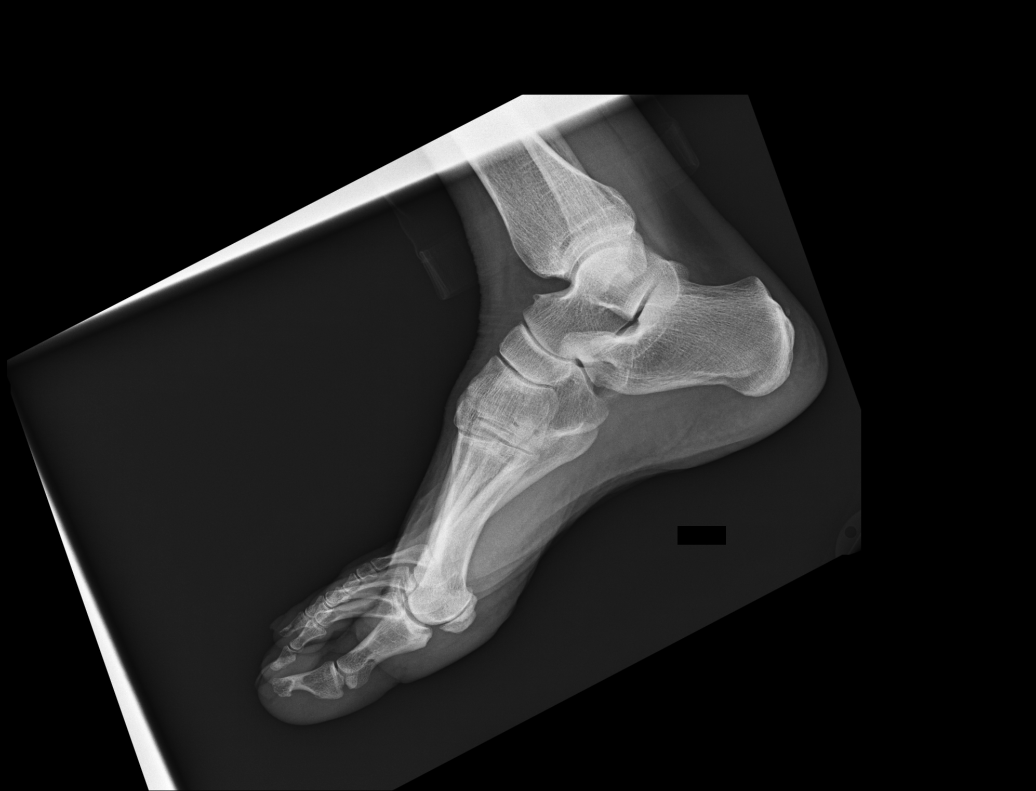
[im 5/6]
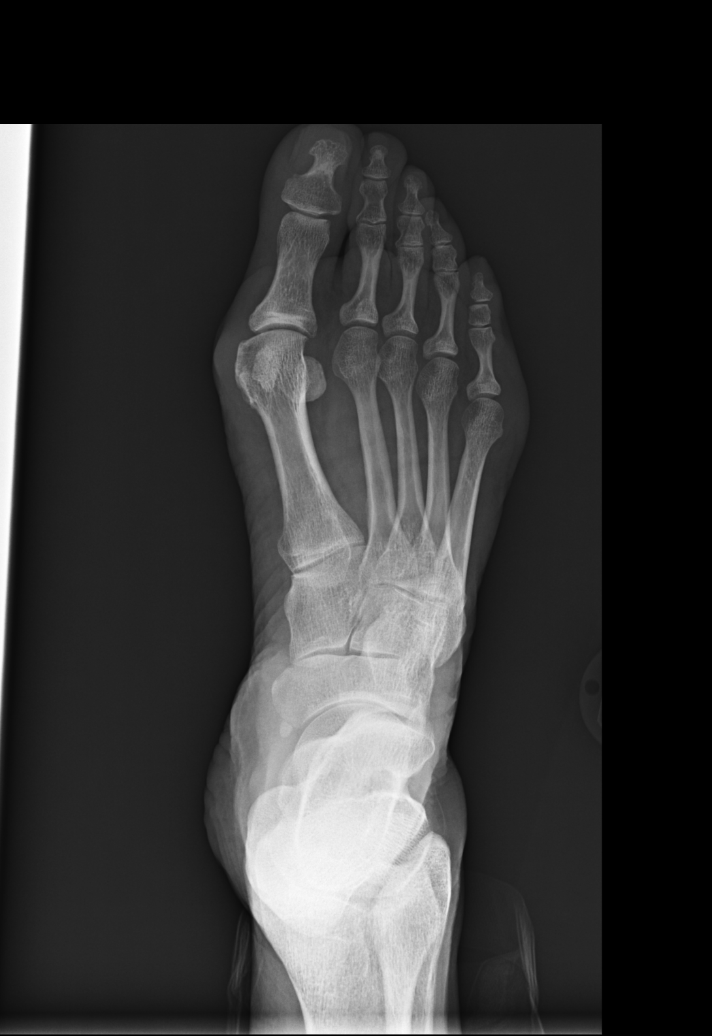
[im 6/6]
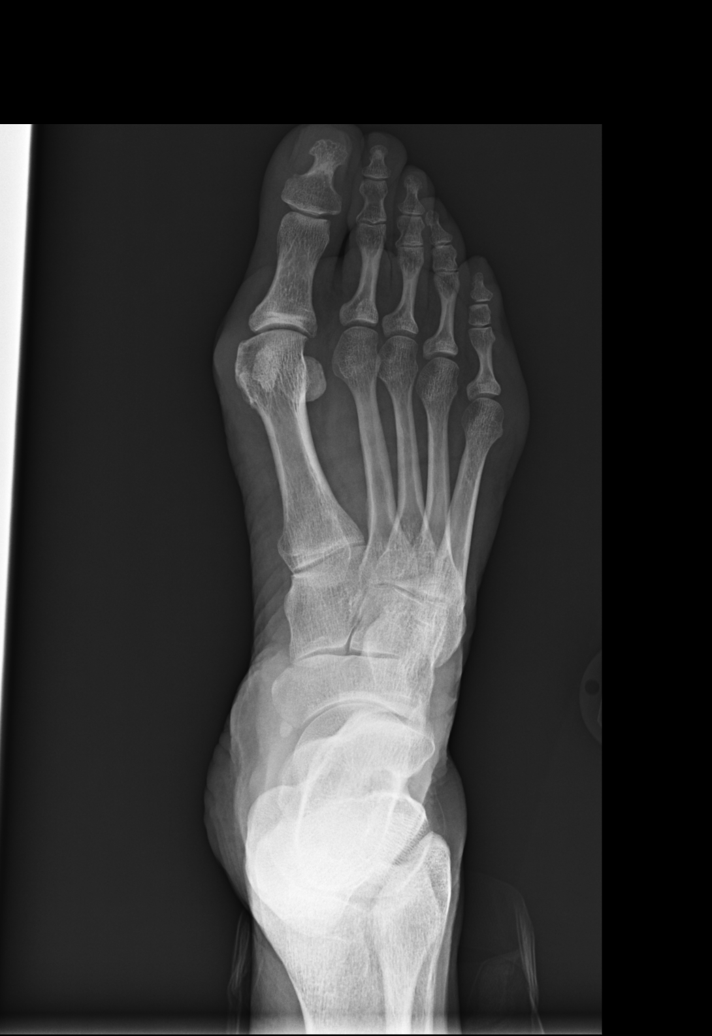

[6 of 6 positions shown; findings below may reference images not displayed]

FINDINGS: Hallux valgus deformity. First MTP joint degenerative changes.
Normal an attention no evidence for acute fracture dislocation.
Regional soft tissues are unremarkable. No evidence of osseous
cortical destruction involving the great toe.
IMPRESSION: No acute osseous abnormality.

## 2017-01-19 ENCOUNTER — Other Ambulatory Visit (HOSPITAL_COMMUNITY): Payer: Self-pay | Admitting: Physician Assistant

## 2017-01-19 ENCOUNTER — Ambulatory Visit (HOSPITAL_COMMUNITY)
Admission: RE | Admit: 2017-01-19 | Discharge: 2017-01-19 | Disposition: A | Discharge status: Home / Self Care | Payer: Medicare Other | Source: Ambulatory Visit | Attending: Physician Assistant | Admitting: Physician Assistant

## 2017-01-19 DIAGNOSIS — J40 Bronchitis, not specified as acute or chronic: Secondary | ICD-10-CM

## 2017-01-19 DIAGNOSIS — J209 Acute bronchitis, unspecified: Secondary | ICD-10-CM | POA: Diagnosis present

## 2017-01-19 DIAGNOSIS — J9811 Atelectasis: Secondary | ICD-10-CM | POA: Diagnosis not present

## 2017-02-20 ENCOUNTER — Encounter: Payer: Self-pay | Admitting: Cardiology

## 2017-03-07 ENCOUNTER — Ambulatory Visit: Payer: Medicare Other | Admitting: Cardiology

## 2017-05-16 ENCOUNTER — Ambulatory Visit: Payer: Medicare Other | Admitting: Cardiology

## 2017-06-12 NOTE — Progress Notes (Signed)
HPI The patient presents for evaluation of palpitations.  Since I last saw her she has been fatigue.  She has been still taking care of her husband who has cancer but is in remission currently.  He is felt congested and has had a cough and has not responded to therapies.  She has not had any problem with palpitations.  She has not had any of the near syncope that she had previously.  She does walk stairs frequently in her home she does not have any problems with this.  She is not having any chest pressure, neck or arm discomfort.  She not having any PND or orthopnea.  She has not had fevers or chills.   Allergies  Allergen Reactions  . Cortisone Rash    Overdose given to patient of this medication and a rash occurred  . Prednisone Rash  . Caffeine Rash    Only in large amounts    Current Outpatient Medications  Medication Sig Dispense Refill  . aspirin 81 MG tablet Take 81 mg by mouth daily.     . Coenzyme Q10 (CO Q 10 PO) Take 200 mg by mouth daily.    Marland Kitchen estradiol (CLIMARA) 0.06 MG/24HR Place 1 patch onto the skin once a week. On Saturdays    . fish oil-omega-3 fatty acids 1000 MG capsule Take 1 g by mouth daily.    . metoprolol succinate (TOPROL-XL) 25 MG 24 hr tablet Take one-half tablet by  mouth two times daily    . naproxen sodium (ALEVE) 220 MG tablet Take 220 mg by mouth once as needed (for pain).    . pravastatin (PRAVACHOL) 40 MG tablet Take 40 mg by mouth every evening.    . Vitamin D, Ergocalciferol, (DRISDOL) 50000 UNITS CAPS Take 50,000 Units by mouth every 7 (seven) days. On Weds    . amLODipine (NORVASC) 2.5 MG tablet Take 1 tablet (2.5 mg total) by mouth daily. 30 tablet 11   No current facility-administered medications for this visit.     Past Medical History:  Diagnosis Date  . Arthritis   . Diverticulosis   . Dysrhythmia    unknown arrythmia  . H/O hiatal hernia   . Hyperlipidemia    takes Fish Oil daily  . Hypertension    takes Metoprolol daily  . IBS  (irritable bowel syndrome)   . Neck pain    HNP  . PONV (postoperative nausea and vomiting)     Past Surgical History:  Procedure Laterality Date  . ABDOMINAL HYSTERECTOMY    . ANTERIOR CERVICAL DECOMP/DISCECTOMY FUSION  07/10/2011   Procedure: ANTERIOR CERVICAL DECOMPRESSION/DISCECTOMY FUSION 2 LEVELS;  Surgeon: Marybelle Killings, MD;  Location: [facility];  Service: Orthopedics;  Laterality: N/A;  C5-6, C6-7 Anterior Cervical Discectomy and Fusion, allograft, plate  . APPENDECTOMY    . CESAREAN SECTION    . COLONOSCOPY    . ESOPHAGOGASTRODUODENOSCOPY      ROS:  As stated in the HPI and negative for all other systems.  PHYSICAL EXAM BP (!) 193/80   Pulse 69   Ht 5\' 2"  (1.575 m)   Wt 152 lb (68.9 kg)   BMI 27.80 kg/m   GENERAL:  Well appearing NECK:  No jugular venous distention, waveform within normal limits, carotid upstroke brisk and symmetric, no bruits, no thyromegaly LUNGS:  Clear to auscultation bilaterally CHEST:  Unremarkable HEART:  PMI not displaced or sustained,S1 and S2 within normal limits, no S3, no S4, no clicks, no rubs,  no murmurs ABD:  Flat, positive bowel sounds normal in frequency in pitch, no bruits, no rebound, no guarding, no midline pulsatile mass, no hepatomegaly, no splenomegaly EXT:  2 plus pulses throughout, no edema, no cyanosis no clubbing   EKG:   Sinus rhythm, rate 69, axis within normal limits, intervals within normal limits, poor anterior R wave progression, no acute ST-T wave changes.  No significant change from previous.  06/13/2017  ASSESSMENT AND PLAN  NEAR SYNCOPE:   She had no further episodes of this.  No further workup is suggested.  PALPITATIONS: She is not feeling any palpitations.  No change in therapy is indicated.    HTN: I repeated and verified her blood pressure.  It is been elevated a couple of times although she says it is normal at her primary care office.  She does not check it at home.  I am going to add Norvasc 2.5 mg today.   Spoke with her daughter-in-law who is a primary care physician and asked her to keep an eye on this for the next few days if she would let me know if she needs further adjustment.

## 2017-06-13 ENCOUNTER — Encounter: Payer: Self-pay | Admitting: Cardiology

## 2017-06-13 ENCOUNTER — Ambulatory Visit: Payer: Medicare Other | Admitting: Cardiology

## 2017-06-13 VITALS — BP 193/80 | HR 69 | Ht 62.0 in | Wt 152.0 lb

## 2017-06-13 DIAGNOSIS — R002 Palpitations: Secondary | ICD-10-CM | POA: Diagnosis not present

## 2017-06-13 DIAGNOSIS — I10 Essential (primary) hypertension: Secondary | ICD-10-CM

## 2017-06-13 MED ORDER — AMLODIPINE BESYLATE 2.5 MG PO TABS: 2.5000 mg | ORAL_TABLET | Freq: Every day | ORAL | 11 refills | Status: DC

## 2017-06-13 NOTE — Patient Instructions (Signed)
Medication Instructions:  Please start Amlodipine 2.5 mg a day. Continue all other medications as listed.  Follow-Up: Follow up in 1 year with Dr. Percival Spanish.  You will receive a letter in the mail 2 months before you are due.  Please call us when you receive this letter to schedule your follow up appointment.  If you need a refill on your cardiac medications before your next appointment, please call your pharmacy.  Thank you for choosing [facility]!!

## 2018-02-20 ENCOUNTER — Other Ambulatory Visit: Payer: Self-pay | Admitting: Cardiology

## 2018-02-28 IMAGING — DX DG CHEST 2V
2 series · 2 of 2 positions shown · non-contrast
Comparison: 06/30/2011; 08/19/2004

CLINICAL DATA: Cough and shortness of breath for the past 4 weeks.
History of hypertension.

EXAM:
CHEST  2 VIEW

[chest pa]
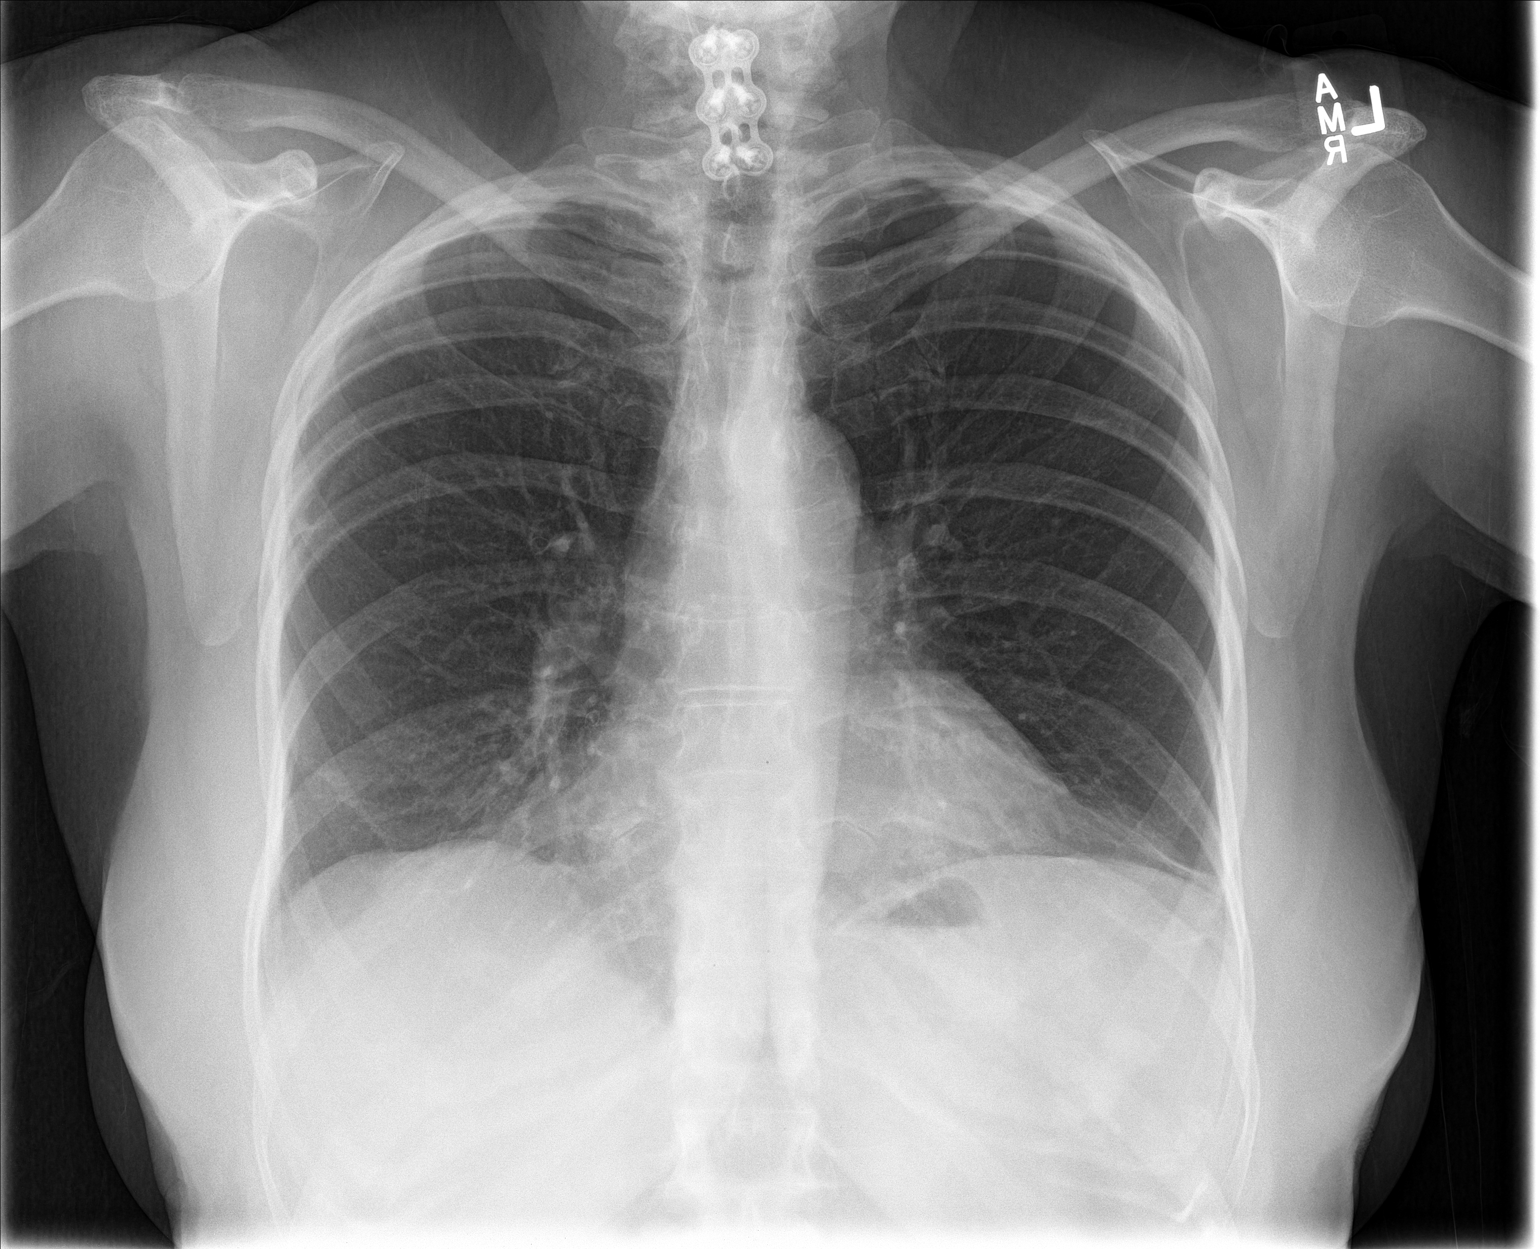

[chest lat]
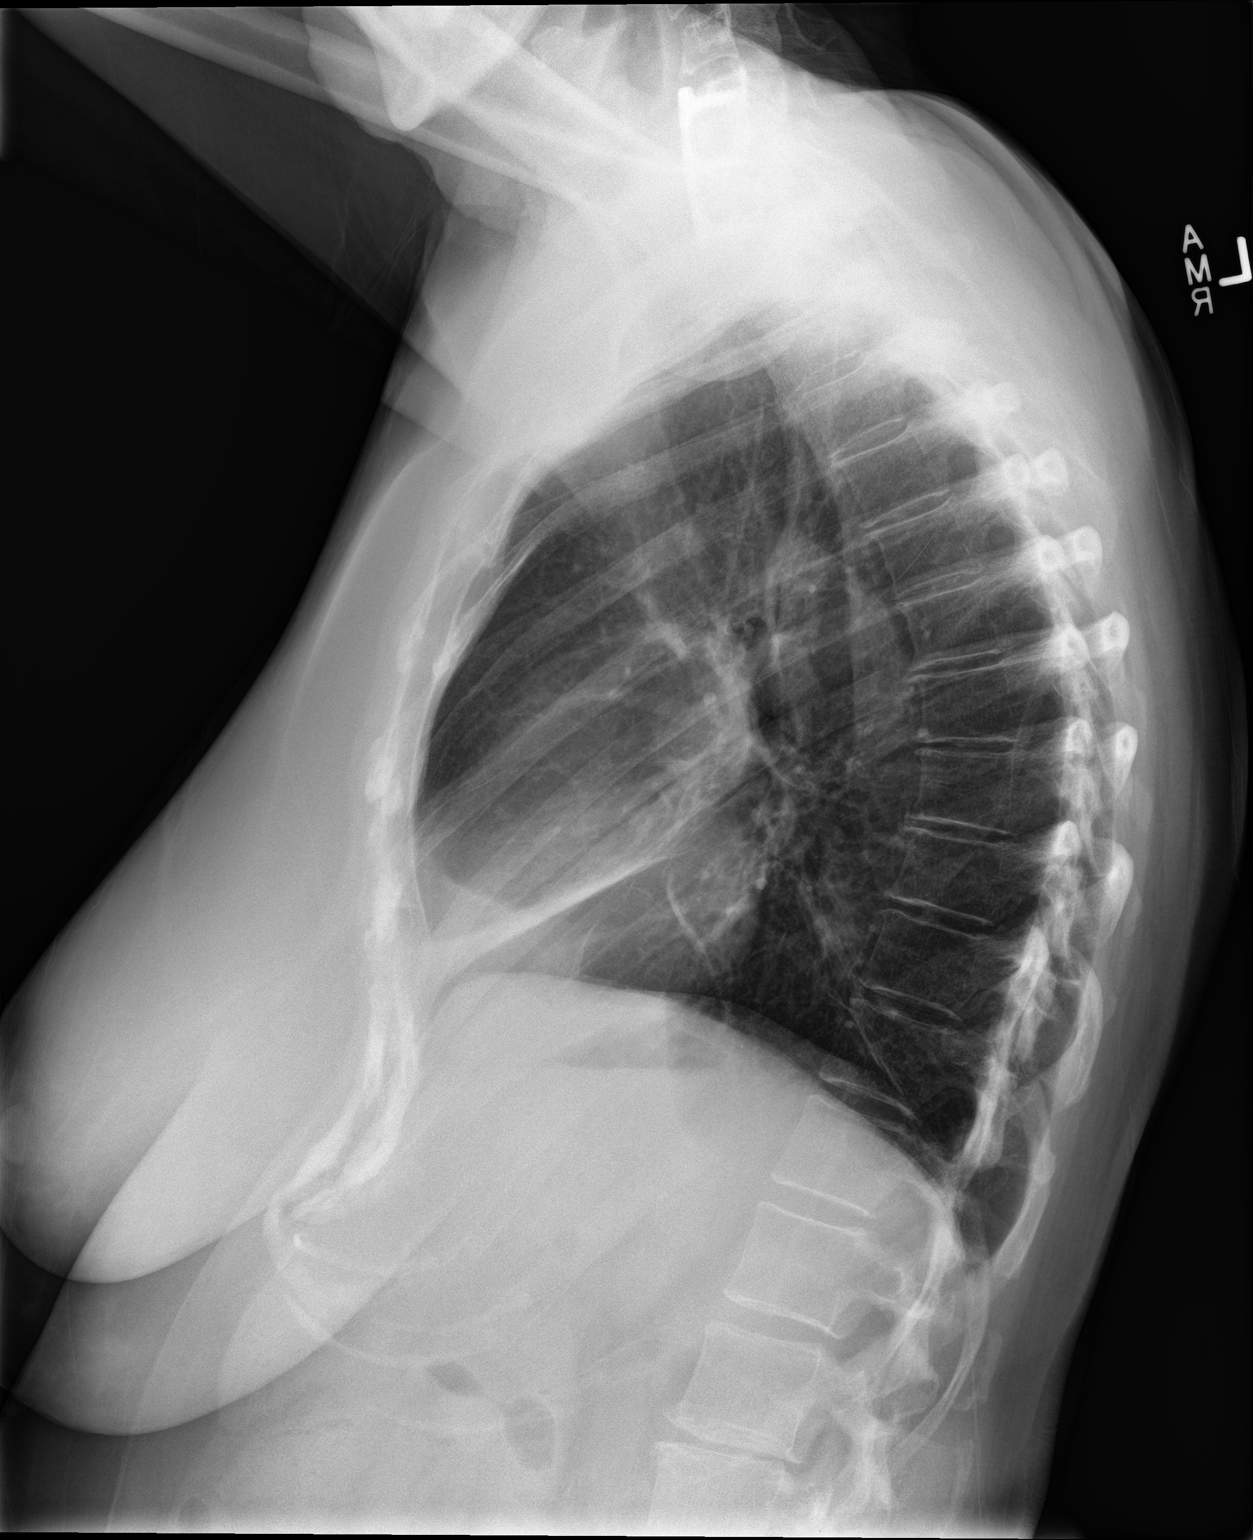

[2 of 2 positions shown; findings below may reference images not displayed]

FINDINGS: Grossly unchanged cardiac silhouette and mediastinal contours.
Linear heterogeneous opacities within the lingula are unchanged
compared to the [DATE] examination. Linear heterogeneous opacities
with the peripheral aspect of the right minor fissure also unchanged
over the [DATE] examination. No new focal airspace opacities. No
pleural effusion or pneumothorax. No evidence of edema. No acute
osseus abnormalities. Post lower cervical ACDF, incompletely
evaluated. Degenerative change of the cranial aspect the lumbar
spine, incompletely evaluated.
IMPRESSION: Chronic lingular atelectasis/scar without superimposed acute
cardiopulmonary disease.

## 2018-07-10 ENCOUNTER — Ambulatory Visit: Payer: Medicare Other | Admitting: Cardiology

## 2018-08-22 ENCOUNTER — Telehealth: Payer: Self-pay | Admitting: *Deleted

## 2018-08-22 NOTE — Telephone Encounter (Signed)
YOUR CARDIOLOGY TEAM HAS ARRANGED FOR AN E-VISIT FOR YOUR APPOINTMENT - PLEASE REVIEW IMPORTANT INFORMATION BELOW SEVERAL DAYS PRIOR TO YOUR APPOINTMENT  Due to the recent COVID-19 pandemic, we are transitioning in-person office visits to tele-medicine visits in an effort to decrease unnecessary exposure to our patients, their families, and staff. These visits are billed to your insurance just like a normal visit is. We also encourage you to sign up for MyChart if you have not already done so. You will need a smartphone if possible. For patients that do not have this, we can still complete the visit using a regular telephone but do prefer a smartphone to enable video when possible. You may have a family member that lives with you that can help. If possible, we also ask that you have a blood pressure cuff and scale at home to measure your blood pressure, heart rate and weight prior to your scheduled appointment. Patients with clinical needs that need an in-person evaluation and testing will still be able to come to the office if absolutely necessary. If you have any questions, feel free to call our office.  YOUR PROVIDER WILL BE USING THE FOLLOWING PLATFORM TO COMPLETE YOUR VISIT:  [email or web address]   . IF USING  [email or web address] - Pt aware Dr Percival Spanish in text the invitation.   2-3 DAYS BEFORE YOUR APPOINTMENT  You will receive a telephone call from one of our [place] team members - your caller ID may say "Unknown caller." If this is a video visit, we will walk you through how to get the video launched on your phone. We will remind you check your blood pressure, heart rate and weight prior to your scheduled appointment. If you have an Apple Watch or Kardia, please upload any pertinent ECG strips the day before or morning of your appointment to [facility]. Our staff will also make sure you have reviewed the consent and agree to move forward with your scheduled tele-health visit.   THE DAY OF YOUR  APPOINTMENT  Approximately 15 minutes prior to your scheduled appointment, you will receive a telephone call from one of [facility] team - your caller ID may say "Unknown caller."  Our staff will confirm medications, vital signs for the day and any symptoms you may be experiencing. Please have this information available prior to the time of visit start. It may also be helpful for you to have a pad of paper and pen handy for any instructions given during your visit. They will also walk you through joining the smartphone meeting if this is a video visit.  CONSENT FOR TELE-HEALTH VISIT - PLEASE REVIEW  I hereby voluntarily request, consent and authorize [facility] and its employed or contracted physicians, physician assistants, nurse practitioners or other licensed health care professionals (the Practitioner), to provide me with telemedicine health care services (the "Services") as deemed necessary by the treating Practitioner. I acknowledge and consent to receive the Services by the Practitioner via telemedicine. I understand that the telemedicine visit will involve communicating with the Practitioner through live audiovisual communication technology and the disclosure of certain medical information by electronic transmission. I acknowledge that I have been given the opportunity to request an in-person assessment or other available alternative prior to the telemedicine visit and am voluntarily participating in the telemedicine visit.  I understand that I have the right to withhold or withdraw my consent to the use of telemedicine in the course of my care at any time, without affecting my right to future care or  treatment, and that the Practitioner or I may terminate the telemedicine visit at any time. I understand that I have the right to inspect all information obtained and/or recorded in the course of the telemedicine visit and may receive copies of available information for a reasonable fee.  I  understand that some of the potential risks of receiving the Services via telemedicine include:  Marland Kitchen Delay or interruption in medical evaluation due to technological equipment failure or disruption; . Information transmitted may not be sufficient (e.g. poor resolution of images) to allow for appropriate medical decision making by the Practitioner; and/or  . In rare instances, security protocols could fail, causing a breach of personal health information.  Furthermore, I acknowledge that it is my responsibility to provide information about my medical history, conditions and care that is complete and accurate to the best of my ability. I acknowledge that Practitioner's advice, recommendations, and/or decision may be based on factors not within their control, such as incomplete or inaccurate data provided by me or distortions of diagnostic images or specimens that may result from electronic transmissions. I understand that the practice of medicine is not an exact science and that Practitioner makes no warranties or guarantees regarding treatment outcomes. I acknowledge that I will receive a copy of this consent concurrently upon execution via email to the email address I last provided but may also request a printed copy by calling the office of [facility].    I understand that my insurance will be billed for this visit.   I have read or had this consent read to me. . I understand the contents of this consent, which adequately explains the benefits and risks of the Services being provided via telemedicine.  . I have been provided ample opportunity to ask questions regarding this consent and the Services and have had my questions answered to my satisfaction. . I give my informed consent for the services to be provided through the use of telemedicine in my medical care  By participating in this telemedicine visit I agree to the above.  Pt gave verbal consent and her husband's cell phone nuber to contact  her on.

## 2018-08-27 NOTE — Progress Notes (Signed)
Virtual Visit via Video Note   This visit type was conducted due to national recommendations for restrictions regarding the COVID-19 Pandemic (e.g. social distancing) in an effort to limit this patient's exposure and mitigate transmission in our community.  Due to her co-morbid illnesses, this patient is at least at moderate risk for complications without adequate follow up.  This format is felt to be most appropriate for this patient at this time.  All issues noted in this document were discussed and addressed.  A limited physical exam was performed with this format.  Please refer to the patient's chart for her consent to telehealth for [facility].   Evaluation Performed:  Follow-up visit  Date:  08/28/2018   ID:  Misty, Hanson 1945-12-30, MRN [id number]  Patient Location: Home Provider Location: Home  PCP:  Dione Housekeeper, MD  Cardiologist: Minus Breeding, MD Electrophysiologist:  None   Chief Complaint:  Palpitations  History of Present Illness:    Misty Hanson is a 73 y.o. female with who presents for evaluation of palpitations.  I saw her last year for this.  She was not complaining of these but her BP was elevated and I started her on Norvasc.    She tolerate this well.  The patient denies any new symptoms such as chest discomfort, neck or arm discomfort. There has been no new shortness of breath, PND or orthopnea. There have been no reported palpitations, presyncope or syncope.  She reports that her blood pressure is typically well controlled with systolics in the 539J.  She does not report chest pressure, neck or arm discomfort.  She does not have any palpitations, presyncope or syncope.  She has had no weight gain or edema.  She is not exercising as much as she should with the pandemic.  The patient does not have symptoms concerning for COVID-19 infection (fever, chills, cough, or new shortness of breath).    Past Medical History:  Diagnosis Date  . Arthritis   .  Diverticulosis   . Dysrhythmia    unknown arrythmia  . H/O hiatal hernia   . Hyperlipidemia    takes Fish Oil daily  . Hypertension    takes Metoprolol daily  . IBS (irritable bowel syndrome)   . Neck pain    HNP  . PONV (postoperative nausea and vomiting)    Past Surgical History:  Procedure Laterality Date  . ABDOMINAL HYSTERECTOMY    . ANTERIOR CERVICAL DECOMP/DISCECTOMY FUSION  07/10/2011   Procedure: ANTERIOR CERVICAL DECOMPRESSION/DISCECTOMY FUSION 2 LEVELS;  Surgeon: Marybelle Killings, MD;  Location: [facility];  Service: Orthopedics;  Laterality: N/A;  C5-6, C6-7 Anterior Cervical Discectomy and Fusion, allograft, plate  . APPENDECTOMY    . CESAREAN SECTION    . COLONOSCOPY    . ESOPHAGOGASTRODUODENOSCOPY       Prior to Admission medications   Medication Sig Start Date End Date Taking? Authorizing Provider  amLODipine (NORVASC) 2.5 MG tablet TAKE ONE (1) TABLET EACH DAY 02/20/18  Yes Minus Breeding, MD  aspirin 81 MG tablet Take 81 mg by mouth daily.  11/08/07  Yes [provider]  Coenzyme Q10 (CO Q 10 PO) Take 200 mg by mouth daily.   Yes [provider]  estradiol (CLIMARA) 0.06 MG/24HR Place 1 patch onto the skin once a week. On Saturdays   Yes [provider]  fish oil-omega-3 fatty acids 1000 MG capsule Take 1 g by mouth daily.   Yes [provider]  metoprolol  succinate (TOPROL-XL) 25 MG 24 hr tablet Take one-half tablet by  mouth two times daily 12/01/15  Yes [provider]  naproxen sodium (ALEVE) 220 MG tablet Take 220 mg by mouth once as needed (for pain).   Yes [provider]  pravastatin (PRAVACHOL) 40 MG tablet Take 40 mg by mouth every evening.   Yes [provider]  Vitamin D, Ergocalciferol, (DRISDOL) 50000 UNITS CAPS Take 50,000 Units by mouth every 7 (seven) days. On Weds   Yes [provider]    Allergies:   Cortisone; Prednisone; and Caffeine   Social History   Tobacco Use  . Smoking  status: Never Smoker  . Smokeless tobacco: Never Used  Substance Use Topics  . Alcohol use: No  . Drug use: No     Family Hx: The patient's family history includes Cancer in her father; Heart failure in her mother. There is no history of Anesthesia problems, Hypotension, Malignant hyperthermia, or Pseudochol deficiency.  ROS:   Please see the history of present illness.    As stated in the HPI and negative for all other systems.   Prior CV studies:   The following studies were reviewed today:  None  Labs/Other Tests and Data Reviewed:    EKG:  No ECG reviewed.  Recent Labs: No results found for requested labs within last 8760 hours.   Recent Lipid Panel No results found for: CHOL, TRIG, HDL, CHOLHDL, LDLCALC, LDLDIRECT  Wt Readings from Last 3 Encounters:  08/28/18 148 lb (67.1 kg)  06/13/17 152 lb (68.9 kg)  01/05/16 147 lb (66.7 kg)     Objective:    Vital Signs:  BP 136/79 (BP Location: Left Arm, Patient Position: Sitting, Cuff Size: Normal)   Pulse 61   Temp 97.7 F (36.5 C)   Ht 5\' 2"  (1.575 m)   Wt 148 lb (67.1 kg)   BMI 27.07 kg/m    VITAL SIGNS:  reviewed GEN:  no acute distress EYES:  sclerae anicteric, EOMI - Extraocular Movements Intact NEURO:  alert and oriented x 3, no obvious focal deficit PSYCH:  normal affect  ASSESSMENT & PLAN:    NEAR SYNCOPE:   She has had no further episodes of this.  No further testing is indicated.   PALPITATIONS:    Is her not particularly bothersome.  No change in therapy.  HTN:    Her blood pressure was borderline.  We talked about this and she agrees that she would try increased exercise and 5 pounds of weight loss and if she can consistently bring it down and the systolic is consistently more elevated in the 140 range I would increase her amlodipine to 5 mg daily.  She was given instructions on how to take her blood pressure.   COVID-19 Education: The signs and symptoms of COVID-19 were discussed with the  patient and how to seek care for testing (follow up with PCP or arrange E-visit).  The importance of social distancing was discussed today.  Time:   Today, I have spent 15  minutes with the patient with telehealth technology discussing the above problems.     Medication Adjustments/Labs and Tests Ordered: Current medicines are reviewed at length with the patient today.  Concerns regarding medicines are outlined above.   Tests Ordered: No orders of the defined types were placed in this encounter.   Medication Changes: No orders of the defined types were placed in this encounter.   Disposition:  Follow up one year  Signed,  Minus Breeding, MD  08/28/2018 9:46 AM    [place]

## 2018-08-28 ENCOUNTER — Telehealth (INDEPENDENT_AMBULATORY_CARE_PROVIDER_SITE_OTHER): Payer: Medicare Other | Admitting: Cardiology

## 2018-08-28 ENCOUNTER — Encounter: Payer: Self-pay | Admitting: Cardiology

## 2018-08-28 VITALS — BP 136/79 | HR 61 | Temp 97.7°F | Ht 62.0 in | Wt 148.0 lb

## 2018-08-28 DIAGNOSIS — R002 Palpitations: Secondary | ICD-10-CM | POA: Diagnosis not present

## 2018-08-28 DIAGNOSIS — Z7189 Other specified counseling: Secondary | ICD-10-CM | POA: Insufficient documentation

## 2018-08-28 DIAGNOSIS — I10 Essential (primary) hypertension: Secondary | ICD-10-CM

## 2018-08-28 NOTE — Patient Instructions (Signed)
Medication Instructions:  The current medical regimen is effective;  continue present plan and medications.  If you need a refill on your cardiac medications before your next appointment, please call your pharmacy.   Follow-Up: Follow up in 1 year with Dr. Hochrein.  You will receive a letter in the mail 2 months before you are due.  Please call us when you receive this letter to schedule your follow up appointment.  Thank you for choosing [facility]!!     

## 2018-09-04 ENCOUNTER — Other Ambulatory Visit: Payer: Self-pay | Admitting: Cardiology

## 2018-09-04 NOTE — Telephone Encounter (Signed)
Amlodipine 2.5 mg refilled. 

## 2019-04-29 ENCOUNTER — Ambulatory Visit (INDEPENDENT_AMBULATORY_CARE_PROVIDER_SITE_OTHER): Payer: Medicare Other | Admitting: Internal Medicine

## 2019-05-13 ENCOUNTER — Encounter (INDEPENDENT_AMBULATORY_CARE_PROVIDER_SITE_OTHER): Payer: Self-pay

## 2019-05-19 ENCOUNTER — Ambulatory Visit (INDEPENDENT_AMBULATORY_CARE_PROVIDER_SITE_OTHER): Payer: Medicare Other | Admitting: Internal Medicine

## 2019-06-25 ENCOUNTER — Encounter (INDEPENDENT_AMBULATORY_CARE_PROVIDER_SITE_OTHER): Payer: Self-pay

## 2019-07-28 ENCOUNTER — Other Ambulatory Visit: Payer: Self-pay

## 2019-07-28 MED ORDER — AMLODIPINE BESYLATE 2.5 MG PO TABS: ORAL_TABLET | ORAL | 3 refills | Status: DC

## 2019-09-02 NOTE — Progress Notes (Signed)
Cardiology Office Note   Date:  09/03/2019   ID:  Misty Hanson, DOB 1945-12-29, MRN [id number]  PCP:  Glenda Chroman, MD  Cardiologist:   Minus Breeding, MD  Chief Complaint  Patient presents with  . Palpitations      History of Present Illness:  Misty Hanson is a 74 y.o. female who presents for evaluation of palpitations.    Since I last saw her she has had no new symptoms. The patient denies any new symptoms such as chest discomfort, neck or arm discomfort. There has been no new shortness of breath, PND or orthopnea. There have been no reported palpitations, presyncope or syncope.  She had a tough year socially and she is lost 2 brothers.  She is taking care of a sick sister.  She still able to be active.  She walks.  She does her chores. The patient denies any new symptoms such as chest discomfort, neck or arm discomfort. There has been no new shortness of breath, PND or orthopnea. There have been no reported palpitations, presyncope or syncope.    Past Medical History:  Diagnosis Date  . Arthritis   . Diverticulosis   . Dysrhythmia    unknown arrythmia  . H/O hiatal hernia   . Hyperlipidemia    takes Fish Oil daily  . Hypertension    takes Metoprolol daily  . IBS (irritable bowel syndrome)   . Neck pain    HNP  . PONV (postoperative nausea and vomiting)     Past Surgical History:  Procedure Laterality Date  . ABDOMINAL HYSTERECTOMY    . ANTERIOR CERVICAL DECOMP/DISCECTOMY FUSION  07/10/2011   Procedure: ANTERIOR CERVICAL DECOMPRESSION/DISCECTOMY FUSION 2 LEVELS;  Surgeon: Marybelle Killings, MD;  Location: [place];  Service: Orthopedics;  Laterality: N/A;  C5-6, C6-7 Anterior Cervical Discectomy and Fusion, allograft, plate  . APPENDECTOMY    . CESAREAN SECTION    . COLONOSCOPY    . ESOPHAGOGASTRODUODENOSCOPY       Current Outpatient Medications  Medication Sig Dispense Refill  . amLODipine (NORVASC) 2.5 MG tablet TAKE ONE (1) TABLET EACH DAY 90 tablet 3  . aspirin  81 MG tablet Take 81 mg by mouth daily.     . Coenzyme Q10 (CO Q 10 PO) Take 200 mg by mouth daily.    Marland Kitchen estradiol (CLIMARA) 0.06 MG/24HR Place 1 patch onto the skin once a week. On Saturdays    . fish oil-omega-3 fatty acids 1000 MG capsule Take 1 g by mouth daily.    . metoprolol succinate (TOPROL-XL) 25 MG 24 hr tablet Take one-half tablet by  mouth two times daily    . naproxen sodium (ALEVE) 220 MG tablet Take 220 mg by mouth once as needed (for pain).    . pravastatin (PRAVACHOL) 40 MG tablet Take 40 mg by mouth every evening.    . Vitamin D, Ergocalciferol, (DRISDOL) 50000 UNITS CAPS Take 50,000 Units by mouth every 7 (seven) days. On Weds     No current facility-administered medications for this visit.    Allergies:   Cortisone, Prednisone, and Caffeine    ROS:  Please see the history of present illness.   Otherwise, review of systems are positive for none.   All other systems are reviewed and negative.    PHYSICAL EXAM: VS:  BP (!) 150/80   Pulse 75   Ht 5\' 2"  (1.575 m)   Wt 145 lb (65.8 kg)   BMI 26.52 kg/m  ,  BMI Body mass index is 26.52 kg/m. GENERAL:  Well appearing NECK:  No jugular venous distention, waveform within normal limits, carotid upstroke brisk and symmetric, no bruits, no thyromegaly LUNGS:  Clear to auscultation bilaterally CHEST:  Unremarkable HEART:  PMI not displaced or sustained,S1 and S2 within normal limits, no S3, no S4, no clicks, no rubs, no murmurs ABD:  Flat, positive bowel sounds normal in frequency in pitch, no bruits, no rebound, no guarding, no midline pulsatile mass, no hepatomegaly, no splenomegaly EXT:  2 plus pulses throughout, no edema, no cyanosis no clubbing    EKG:  EKG is ordered today. The ekg ordered today demonstrates sinus rhythm, rate 75, axis within normal limits, intervals within normal limits, poor anterior R wave progression, no acute ST-T wave changes.   Recent Labs: No results found for requested labs within last  8760 hours.    Lipid Panel No results found for: CHOL, TRIG, HDL, CHOLHDL, VLDL, LDLCALC, LDLDIRECT    Wt Readings from Last 3 Encounters:  09/03/19 145 lb (65.8 kg)  08/28/18 148 lb (67.1 kg)  06/13/17 152 lb (68.9 kg)      Other studies Reviewed: Additional studies/ records that were reviewed today include: None. Review of the above records demonstrates:  NA   ASSESSMENT AND PLAN:   PALPITATIONS:   She is no longer having palpitations.  No change in therapy.  She will continue the low-dose beta-blocker.  HTN:  The blood pressure is elevated today but she says this is quite unusual.  No change in therapies.  She will keep a blood pressure diary which she does at home when she sits in the 130s with 60.  H  ABNORMAL EKG: The EKG does demonstrate poor anterior R wave progression.  I do not see any evidence of a prior myocardial infarction on her previous echo.  No further work-up.  COVID EDUCATION: She has had her vaccinations.   Current medicines are reviewed at length with the patient today.  The patient does not have concerns regarding medicines.  The following changes have been made:  no change  Labs/ tests ordered today include: None  Orders Placed This Encounter  Procedures  . EKG 12-Lead     Disposition:   FU with me in one year.     Signed, Minus Breeding, MD  09/03/2019 10:38 AM    [facility]

## 2019-09-03 ENCOUNTER — Other Ambulatory Visit: Payer: Self-pay

## 2019-09-03 ENCOUNTER — Encounter: Payer: Self-pay | Admitting: Cardiology

## 2019-09-03 ENCOUNTER — Ambulatory Visit (INDEPENDENT_AMBULATORY_CARE_PROVIDER_SITE_OTHER): Payer: Medicare Other | Admitting: Cardiology

## 2019-09-03 VITALS — BP 150/80 | HR 75 | Ht 62.0 in | Wt 145.0 lb

## 2019-09-03 DIAGNOSIS — Z7189 Other specified counseling: Secondary | ICD-10-CM

## 2019-09-03 DIAGNOSIS — I10 Essential (primary) hypertension: Secondary | ICD-10-CM

## 2019-09-03 DIAGNOSIS — R002 Palpitations: Secondary | ICD-10-CM

## 2019-09-03 NOTE — Patient Instructions (Signed)
Medication Instructions:  The current medical regimen is effective;  continue present plan and medications.  *If you need a refill on your cardiac medications before your next appointment, please call your pharmacy*  Follow-Up: At [facility], you and your health needs are our priority.  As part of our continuing mission to provide you with exceptional heart care, we have created designated Provider Care Teams.  These Care Teams include your primary Cardiologist (physician) and Advanced Practice Providers (APPs -  Physician Assistants and Nurse Practitioners) who all work together to provide you with the care you need, when you need it.  We recommend signing up for the patient portal called "MyChart".  Sign up information is provided on this After Visit Summary.  MyChart is used to connect with patients for Virtual Visits (Telemedicine).  Patients are able to view lab/test results, encounter notes, upcoming appointments, etc.  Non-urgent messages can be sent to your provider as well.   To learn more about what you can do with MyChart, go to [email or web address].    Your next appointment:   12 month(s)  The format for your next appointment:   In Person  Provider:   James Hochrein, MD   Thank you for choosing [facility]!!     

## 2019-09-16 ENCOUNTER — Ambulatory Visit (INDEPENDENT_AMBULATORY_CARE_PROVIDER_SITE_OTHER): Payer: Medicare Other | Admitting: Internal Medicine

## 2019-09-19 ENCOUNTER — Telehealth: Payer: Self-pay | Admitting: Cardiology

## 2019-09-19 NOTE — Telephone Encounter (Signed)
Pt aware of Dr. Rosezella Florida recommendation. She will follow up with her PCP.

## 2019-09-19 NOTE — Telephone Encounter (Signed)
I would suggest that it is unlikely to be these meds.  I would suggest starting with her Glenda Chroman, MD

## 2019-09-19 NOTE — Telephone Encounter (Signed)
   Pt c/o medication issue:  1. Name of Medication:   amLODipine (NORVASC) 2.5 MG tablet    metoprolol succinate (TOPROL-XL) 25 MG 24 hr tablet     2. How are you currently taking this medication (dosage and times per day)? TAKE ONE (1) TABLET EACH DAY  3. Are you having a reaction (difficulty breathing--STAT)?   4. What is your medication issue? Pt said Dr. Percival Spanish prescribed this med and she started having dry cough, she wasn't sure if its related to this drugs but it started when he starts taking them together

## 2019-09-19 NOTE — Telephone Encounter (Signed)
Pt calling today with c/o persistent cough x 1 month. She states it is kind of "croupy." She is wondering if it could be her Amlodipine or metoprolol. She states she has been taking these medications for sometime and she has never had side effects from it in the past. I advised her that her BP medicines may sometime cause a cough, but not common with the types she is taking.   I advised her I would forward to Dr. Percival Spanish for his recommendation. I also suggested she call her PCP to rule out URI.   She agrees with this plan and will await a return call from [place].

## 2019-09-23 DIAGNOSIS — Z299 Encounter for prophylactic measures, unspecified: Secondary | ICD-10-CM | POA: Diagnosis not present

## 2019-09-23 DIAGNOSIS — R05 Cough: Secondary | ICD-10-CM | POA: Diagnosis not present

## 2019-09-23 DIAGNOSIS — I10 Essential (primary) hypertension: Secondary | ICD-10-CM | POA: Diagnosis not present

## 2019-12-31 DIAGNOSIS — E559 Vitamin D deficiency, unspecified: Secondary | ICD-10-CM | POA: Diagnosis not present

## 2019-12-31 DIAGNOSIS — Z0000 Encounter for general adult medical examination without abnormal findings: Secondary | ICD-10-CM | POA: Diagnosis not present

## 2019-12-31 DIAGNOSIS — Z299 Encounter for prophylactic measures, unspecified: Secondary | ICD-10-CM | POA: Diagnosis not present

## 2019-12-31 DIAGNOSIS — Z7189 Other specified counseling: Secondary | ICD-10-CM | POA: Diagnosis not present

## 2019-12-31 DIAGNOSIS — Z79899 Other long term (current) drug therapy: Secondary | ICD-10-CM | POA: Diagnosis not present

## 2019-12-31 DIAGNOSIS — R5383 Other fatigue: Secondary | ICD-10-CM | POA: Diagnosis not present

## 2019-12-31 DIAGNOSIS — I10 Essential (primary) hypertension: Secondary | ICD-10-CM | POA: Diagnosis not present

## 2019-12-31 DIAGNOSIS — E7800 Pure hypercholesterolemia, unspecified: Secondary | ICD-10-CM | POA: Diagnosis not present

## 2020-01-16 ENCOUNTER — Other Ambulatory Visit: Payer: Self-pay | Admitting: *Deleted

## 2020-01-16 MED ORDER — AMLODIPINE BESYLATE 2.5 MG PO TABS: ORAL_TABLET | ORAL | 2 refills | Status: DC

## 2020-01-22 DIAGNOSIS — H04123 Dry eye syndrome of bilateral lacrimal glands: Secondary | ICD-10-CM | POA: Diagnosis not present

## 2020-01-22 DIAGNOSIS — H40033 Anatomical narrow angle, bilateral: Secondary | ICD-10-CM | POA: Diagnosis not present

## 2020-02-18 ENCOUNTER — Other Ambulatory Visit: Payer: Self-pay | Admitting: Cardiology

## 2020-02-18 MED ORDER — AMLODIPINE BESYLATE 2.5 MG PO TABS: 2.5000 mg | ORAL_TABLET | Freq: Every day | ORAL | 2 refills | Status: DC

## 2020-02-18 NOTE — Telephone Encounter (Signed)
Rx(s) sent to pharmacy electronically.  

## 2020-02-18 NOTE — Telephone Encounter (Signed)
*  STAT* If patient is at the pharmacy, call can be transferred to refill team.   1. Which medications need to be refilled? (please list name of each medication and dose if known) amLODipine (NORVASC) 2.5 MG tablet  2. Which pharmacy/location (including street and city if local pharmacy) is medication to be sent to? [facility] - phone number: [phone]  3. Do they need a 30 day or 90 day supply? 90 day supply

## 2020-05-18 DIAGNOSIS — Z1231 Encounter for screening mammogram for malignant neoplasm of breast: Secondary | ICD-10-CM | POA: Diagnosis not present

## 2020-06-22 ENCOUNTER — Other Ambulatory Visit: Payer: Self-pay

## 2020-06-22 ENCOUNTER — Encounter (INDEPENDENT_AMBULATORY_CARE_PROVIDER_SITE_OTHER): Payer: Self-pay

## 2020-06-22 ENCOUNTER — Other Ambulatory Visit (INDEPENDENT_AMBULATORY_CARE_PROVIDER_SITE_OTHER): Payer: Self-pay

## 2020-06-22 ENCOUNTER — Encounter (INDEPENDENT_AMBULATORY_CARE_PROVIDER_SITE_OTHER): Payer: Self-pay | Admitting: Internal Medicine

## 2020-06-22 ENCOUNTER — Telehealth (INDEPENDENT_AMBULATORY_CARE_PROVIDER_SITE_OTHER): Payer: Self-pay

## 2020-06-22 ENCOUNTER — Ambulatory Visit (INDEPENDENT_AMBULATORY_CARE_PROVIDER_SITE_OTHER): Payer: Medicare Other | Admitting: Internal Medicine

## 2020-06-22 DIAGNOSIS — K219 Gastro-esophageal reflux disease without esophagitis: Secondary | ICD-10-CM

## 2020-06-22 DIAGNOSIS — R1012 Left upper quadrant pain: Secondary | ICD-10-CM | POA: Diagnosis not present

## 2020-06-22 DIAGNOSIS — Z86010 Personal history of colonic polyps: Secondary | ICD-10-CM | POA: Diagnosis not present

## 2020-06-22 DIAGNOSIS — Z9889 Other specified postprocedural states: Secondary | ICD-10-CM

## 2020-06-22 DIAGNOSIS — Z1211 Encounter for screening for malignant neoplasm of colon: Secondary | ICD-10-CM

## 2020-06-22 MED ORDER — NA SULFATE-K SULFATE-MG SULF 17.5-3.13-1.6 GM/177ML PO SOLN: 354.0000 mL | Freq: Once | ORAL | 0 refills | Status: AC

## 2020-06-22 NOTE — H&P (View-Only) (Signed)
Reason for consultation  History of rectal adenoma.  Incomplete colonoscopy.  History of present illness  Misty Hanson is 75 year old Caucasian female who is referred through courtesy of Dr. Adelina Mings for colonoscopy. Misty Hanson had colonoscopy on 01/12/2018.  Referral was made long time ago with Misty Hanson was not able to come before on account of her husband's illness. Misty Hanson states she had 2 prior colonoscopies by Dr. Nori Riis and no polyps were found.  She was noted to have heme positive stool leading to colonoscopy in September 2019. She denies melena or rectal bleeding.  She is prone to constipation and may occasionally take stool softener which is not listed above.  She has good appetite.  Her weight has been stable.  She has history of GERD.  She says esomeprazole is working very well.  She denies dysphagia. She does complain of intermittent left upper quadrant pain which she has had off and on since her second colonoscopy.  This pain is not associated with diarrhea nausea or vomiting.  Pain does not last long.  She is not sure if it is made worse with physical activity. Misty Hanson says she takes Aleve occasionally. Misty Hanson states her husband first was treated for metastatic melanoma and subsequent for prostate cancer and appears to be doing well.  Current Medications: Outpatient Encounter Medications as of 06/22/2020  Medication Sig  . amLODipine (NORVASC) 2.5 MG tablet Take 1 tablet (2.5 mg total) by mouth daily.  Marland Kitchen aspirin 81 MG tablet Take 81 mg by mouth daily.   . Biotin w/ Vitamins C & E (HAIR SKIN & NAILS GUMMIES PO) Take by mouth. Misty Hanson states that she chews 2 gummies a day.  . Coenzyme Q10 (CO Q 10 PO) Take 400 mg by mouth daily.  Marland Kitchen esomeprazole (NEXIUM) 20 MG capsule Take 20 mg by mouth daily at 12 noon.  Marland Kitchen estradiol (CLIMARA) 0.06 MG/24HR Place 1 patch onto the skin once a week. On Saturdays  . fish oil-omega-3 fatty acids 1000 MG capsule Take 1,200 mg by mouth daily.  . metoprolol  succinate (TOPROL-XL) 25 MG 24 hr tablet Take one-half tablet by  mouth two times daily  . naproxen sodium (ALEVE) 220 MG tablet Take 220 mg by mouth once as needed (for pain).  . pravastatin (PRAVACHOL) 40 MG tablet Take 40 mg by mouth every evening.  . Vitamin D, Ergocalciferol, (DRISDOL) 50000 UNITS CAPS Take 50,000 Units by mouth every 7 (seven) days. On Weds   Past medical history  Chronic GERD.  EGD more than 10 years ago revealed small sliding hiatal hernia otherwise unremarkable. Hypertension. Hyperlipidemia. History of palpitations.  Followed by Dr. Percival Spanish.  EKG on 09/03/2019. Small rectal adenoma on colonoscopy of September 2019 performed for heme positive stool. Colonic diverticulosis noted on colonoscopy  Appendectomy at age 8 C-section x1 Abdominal hysterectomy. C5-C6, C6-C7 anterior cervical discectomy, fusion with allograft and plate in March 6568.  Allergies  Allergies  Allergen Reactions  . Cortisone Rash    Overdose given to Misty Hanson of this medication and a rash occurred  . Prednisone Rash  . Caffeine Rash    Only in large amounts    Family history  Father died of stomach cancer age 63.  Mother had CHF and lived to be 79. Misty Hanson says she had 12 siblings and only 2 are living.  She has 1 brother and 1 sister living.  Most of her siblings smoke cigarettes and had COPD or cardiac disease.  1 brother was treated for prostate carcinoma but died  of unrelated causes.  Social history  Misty Hanson is married.  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  She has 2 children.  Her daughter age 65 has Crohn's disease.  She has son age 30 in good health. Misty Hanson has never smoked cigarettes or drank alcohol. She stays busy and walks on a regular basis  Physical examination  Blood pressure (!) 178/79, pulse 73, temperature 97.9 F (36.6 C), temperature source Oral, height 5\' 2"  (1.575 m), weight 146 lb 6.4 oz (66.4 kg). Misty Hanson is alert and in no acute  distress. She is wearing a mask. Conjunctiva is pink. Sclera is nonicteric Oropharyngeal mucosa is normal. No neck masses or thyromegaly noted. Cardiac exam with regular rhythm normal S1 and S2. No murmur or gallop noted. Lungs are clear to auscultation. Abdomen is symmetrical.  She scars from previous appendectomy C-section and hysterectomy.  Abdomen is soft and nontender with organomegaly or masses. No LE edema or clubbing noted.  Labs/studies Results:  No recent lab data on file.   Assessment:  #1.  History of rectal adenoma.  However last colonoscopy in September 2020 was incomplete.  Therefore she will undergo repeat colonoscopy to be performed with pediatric scope and are ultraslim scope. Sporadic left upper quadrant pain possibly musculoskeletal. Procedure is reviewed with Misty Hanson and she is agreeable.  #2.  Chronic GERD.  Symptoms maintained on low-dose PPI and dietary measures.  History of EGD more than 10 years ago.  No indication for EGD at this time.  Recommendations  Colonoscopy under monitored anesthesia care Scope guide would be used during the procedure since Misty Hanson's last colonoscopy was incomplete.

## 2020-06-22 NOTE — Telephone Encounter (Signed)
LeighAnn Meagan Ancona, CMA  

## 2020-06-22 NOTE — Progress Notes (Signed)
Reason for consultation  History of rectal adenoma.  Incomplete colonoscopy.  History of present illness  Patient is 75 year old Caucasian female who is referred through courtesy of Dr. Adelina Mings for colonoscopy. Patient had colonoscopy on 01/12/2018.  Referral was made long time ago with patient was not able to come before on account of her husband's illness. Patient states she had 2 prior colonoscopies by Dr. Nori Riis and no polyps were found.  She was noted to have heme positive stool leading to colonoscopy in September 2019. She denies melena or rectal bleeding.  She is prone to constipation and may occasionally take stool softener which is not listed above.  She has good appetite.  Her weight has been stable.  She has history of GERD.  She says esomeprazole is working very well.  She denies dysphagia. She does complain of intermittent left upper quadrant pain which she has had off and on since her second colonoscopy.  This pain is not associated with diarrhea nausea or vomiting.  Pain does not last long.  She is not sure if it is made worse with physical activity. Patient says she takes Aleve occasionally. Patient states her husband first was treated for metastatic melanoma and subsequent for prostate cancer and appears to be doing well.  Current Medications: Outpatient Encounter Medications as of 06/22/2020  Medication Sig  . amLODipine (NORVASC) 2.5 MG tablet Take 1 tablet (2.5 mg total) by mouth daily.  Marland Kitchen aspirin 81 MG tablet Take 81 mg by mouth daily.   . Biotin w/ Vitamins C & E (HAIR SKIN & NAILS GUMMIES PO) Take by mouth. Patient states that she chews 2 gummies a day.  . Coenzyme Q10 (CO Q 10 PO) Take 400 mg by mouth daily.  Marland Kitchen esomeprazole (NEXIUM) 20 MG capsule Take 20 mg by mouth daily at 12 noon.  Marland Kitchen estradiol (CLIMARA) 0.06 MG/24HR Place 1 patch onto the skin once a week. On Saturdays  . fish oil-omega-3 fatty acids 1000 MG capsule Take 1,200 mg by mouth daily.  . metoprolol  succinate (TOPROL-XL) 25 MG 24 hr tablet Take one-half tablet by  mouth two times daily  . naproxen sodium (ALEVE) 220 MG tablet Take 220 mg by mouth once as needed (for pain).  . pravastatin (PRAVACHOL) 40 MG tablet Take 40 mg by mouth every evening.  . Vitamin D, Ergocalciferol, (DRISDOL) 50000 UNITS CAPS Take 50,000 Units by mouth every 7 (seven) days. On Weds   Past medical history  Chronic GERD.  EGD more than 10 years ago revealed small sliding hiatal hernia otherwise unremarkable. Hypertension. Hyperlipidemia. History of palpitations.  Followed by Dr. Percival Spanish.  EKG on 09/03/2019. Small rectal adenoma on colonoscopy of September 2019 performed for heme positive stool. Colonic diverticulosis noted on colonoscopy  Appendectomy at age 34 C-section x1 Abdominal hysterectomy. C5-C6, C6-C7 anterior cervical discectomy, fusion with allograft and plate in March 7622.  Allergies  Allergies  Allergen Reactions  . Cortisone Rash    Overdose given to patient of this medication and a rash occurred  . Prednisone Rash  . Caffeine Rash    Only in large amounts    Family history  Father died of stomach cancer age 37.  Mother had CHF and lived to be 29. Patient says she had 12 siblings and only 2 are living.  She has 1 brother and 1 sister living.  Most of her siblings smoke cigarettes and had COPD or cardiac disease.  1 brother was treated for prostate carcinoma but died  of unrelated causes.  Social history  Patient is married.  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  She has 2 children.  Her daughter age 66 has Crohn's disease.  She has son age 41 in good health. Patient has never smoked cigarettes or drank alcohol. She stays busy and walks on a regular basis  Physical examination  Blood pressure (!) 178/79, pulse 73, temperature 97.9 F (36.6 C), temperature source Oral, height 5\' 2"  (1.575 m), weight 146 lb 6.4 oz (66.4 kg). Patient is alert and in no acute  distress. She is wearing a mask. Conjunctiva is pink. Sclera is nonicteric Oropharyngeal mucosa is normal. No neck masses or thyromegaly noted. Cardiac exam with regular rhythm normal S1 and S2. No murmur or gallop noted. Lungs are clear to auscultation. Abdomen is symmetrical.  She scars from previous appendectomy C-section and hysterectomy.  Abdomen is soft and nontender with organomegaly or masses. No LE edema or clubbing noted.  Labs/studies Results:  No recent lab data on file.   Assessment:  #1.  History of rectal adenoma.  However last colonoscopy in September 2020 was incomplete.  Therefore she will undergo repeat colonoscopy to be performed with pediatric scope and are ultraslim scope. Sporadic left upper quadrant pain possibly musculoskeletal. Procedure is reviewed with patient and she is agreeable.  #2.  Chronic GERD.  Symptoms maintained on low-dose PPI and dietary measures.  History of EGD more than 10 years ago.  No indication for EGD at this time.  Recommendations  Colonoscopy under monitored anesthesia care Scope guide would be used during the procedure since patient's last colonoscopy was incomplete.

## 2020-06-22 NOTE — Patient Instructions (Signed)
Colonoscopy to be scheduled within the next few weeks. Please remember to stop aspirin 2 days before procedure

## 2020-07-02 ENCOUNTER — Encounter (INDEPENDENT_AMBULATORY_CARE_PROVIDER_SITE_OTHER): Payer: Self-pay

## 2020-07-02 NOTE — Patient Instructions (Signed)
Misty Hanson  07/02/2020     @PREFPERIOPPHARMACY @   Your procedure is scheduled on  07/07/2020.   Report to [facility] at  [phone]  A.M.   Call this number if you have problems the morning of surgery:  [phone]   Remember:            Follow the diet and prep instructions given to you by the office.                    Take these medicines the morning of surgery with A SIP OF WATER  Amlodipine, metoprolol.    Please brush your teeth.  Do not wear jewelry, make-up or nail polish.  Do not wear lotions, powders, or perfumes, or deodorant.  Do not shave 48 hours prior to surgery.  Men may shave face and neck.  Do not bring valuables to the hospital.  [facility] is not responsible for any belongings or valuables.  Contacts, dentures or bridgework may not be worn into surgery.  Leave your suitcase in the car.  After surgery it may be brought to your room.  For patients admitted to the hospital, discharge time will be determined by your treatment team.  Patients discharged the day of surgery will not be allowed to drive home and must have someone with them for 24 hours.    Special instructions:  DO NOT smoke tobacco or vape the morning of your procedure.   Please read over the following fact sheets that you were given. Anesthesia Post-op Instructions and Care and Recovery After Surgery       Colonoscopy, Adult, Care After This sheet gives you information about how to care for yourself after your procedure. Your health care provider may also give you more specific instructions. If you have problems or questions, contact your health care provider. What can I expect after the procedure? After the procedure, it is common to have:  A small amount of blood in your stool for 24 hours after the procedure.  Some gas.  Mild cramping or bloating of your abdomen. Follow these instructions at home: Eating and drinking  Drink enough fluid to keep your urine pale  yellow.  Follow instructions from your health care provider about eating or drinking restrictions.  Resume your normal diet as instructed by your health care provider. Avoid heavy or fried foods that are hard to digest.   Activity  Rest as told by your health care provider.  Avoid sitting for a long time without moving. Get up to take short walks every 1-2 hours. This is important to improve blood flow and breathing. Ask for help if you feel weak or unsteady.  Return to your normal activities as told by your health care provider. Ask your health care provider what activities are safe for you. Managing cramping and bloating  Try walking around when you have cramps or feel bloated.  Apply heat to your abdomen as told by your health care provider. Use the heat source that your health care provider recommends, such as a moist heat pack or a heating pad. ? Place a towel between your skin and the heat source. ? Leave the heat on for 20-30 minutes. ? Remove the heat if your skin turns bright red. This is especially important if you are unable to feel pain, heat, or cold. You may have a greater risk of getting burned.   General instructions  If you were given  a sedative during the procedure, it can affect you for several hours. Do not drive or operate machinery until your health care provider says that it is safe.  For the first 24 hours after the procedure: ? Do not sign important documents. ? Do not drink alcohol. ? Do your regular daily activities at a slower pace than normal. ? Eat soft foods that are easy to digest.  Take over-the-counter and prescription medicines only as told by your health care provider.  Keep all follow-up visits as told by your health care provider. This is important. Contact a health care provider if:  You have blood in your stool 2-3 days after the procedure. Get help right away if you have:  More than a small spotting of blood in your stool.  Large blood  clots in your stool.  Swelling of your abdomen.  Nausea or vomiting.  A fever.  Increasing pain in your abdomen that is not relieved with medicine. Summary  After the procedure, it is common to have a small amount of blood in your stool. You may also have mild cramping and bloating of your abdomen.  If you were given a sedative during the procedure, it can affect you for several hours. Do not drive or operate machinery until your health care provider says that it is safe.  Get help right away if you have a lot of blood in your stool, nausea or vomiting, a fever, or increased pain in your abdomen. This information is not intended to replace advice given to you by your health care provider. Make sure you discuss any questions you have with your health care provider. Document Revised: 04/11/2019 Document Reviewed: 11/11/2018 Elsevier Patient Education  2021 [place] After This sheet gives you information about how to care for yourself after your procedure. Your health care provider may also give you more specific instructions. If you have problems or questions, contact your health care provider. What can I expect after the procedure? After the procedure, it is common to have:  Tiredness.  Forgetfulness about what happened after the procedure.  Impaired judgment for important decisions.  Nausea or vomiting.  Some difficulty with balance. Follow these instructions at home: For the time period you were told by your health care provider:  Rest as needed.  Do not participate in activities where you could fall or become injured.  Do not drive or use machinery.  Do not drink alcohol.  Do not take sleeping pills or medicines that cause drowsiness.  Do not make important decisions or sign legal documents.  Do not take care of children on your own.      Eating and drinking  Follow the diet that is recommended by your health care  provider.  Drink enough fluid to keep your urine pale yellow.  If you vomit: ? Drink water, juice, or soup when you can drink without vomiting. ? Make sure you have little or no nausea before eating solid foods. General instructions  Have a responsible adult stay with you for the time you are told. It is important to have someone help care for you until you are awake and alert.  Take over-the-counter and prescription medicines only as told by your health care provider.  If you have sleep apnea, surgery and certain medicines can increase your risk for breathing problems. Follow instructions from your health care provider about wearing your sleep device: ? Anytime you are sleeping, including during daytime naps. ? While  taking prescription pain medicines, sleeping medicines, or medicines that make you drowsy.  Avoid smoking.  Keep all follow-up visits as told by your health care provider. This is important. Contact a health care provider if:  You keep feeling nauseous or you keep vomiting.  You feel light-headed.  You are still sleepy or having trouble with balance after 24 hours.  You develop a rash.  You have a fever.  You have redness or swelling around the IV site. Get help right away if:  You have trouble breathing.  You have new-onset confusion at home. Summary  For several hours after your procedure, you may feel tired. You may also be forgetful and have poor judgment.  Have a responsible adult stay with you for the time you are told. It is important to have someone help care for you until you are awake and alert.  Rest as told. Do not drive or operate machinery. Do not drink alcohol or take sleeping pills.  Get help right away if you have trouble breathing, or if you suddenly become confused. This information is not intended to replace advice given to you by your health care provider. Make sure you discuss any questions you have with your health care  provider. Document Revised: 01/01/2020 Document Reviewed: 03/20/2019 Elsevier Patient Education  2021 [employer].

## 2020-07-05 ENCOUNTER — Encounter (HOSPITAL_COMMUNITY): Payer: Self-pay

## 2020-07-05 ENCOUNTER — Encounter (HOSPITAL_COMMUNITY)
Admission: RE | Admit: 2020-07-05 | Discharge: 2020-07-05 | Disposition: A | Discharge status: Home / Self Care | Payer: Medicare Other | Source: Ambulatory Visit | Attending: Internal Medicine | Admitting: Internal Medicine

## 2020-07-05 ENCOUNTER — Other Ambulatory Visit (HOSPITAL_COMMUNITY)
Admission: RE | Admit: 2020-07-05 | Discharge: 2020-07-05 | Disposition: A | Discharge status: Home / Self Care | Payer: Medicare Other | Source: Ambulatory Visit | Attending: Internal Medicine | Admitting: Internal Medicine

## 2020-07-05 ENCOUNTER — Other Ambulatory Visit: Payer: Self-pay

## 2020-07-05 DIAGNOSIS — Z01812 Encounter for preprocedural laboratory examination: Secondary | ICD-10-CM | POA: Diagnosis not present

## 2020-07-05 DIAGNOSIS — Z20822 Contact with and (suspected) exposure to covid-19: Secondary | ICD-10-CM | POA: Insufficient documentation

## 2020-07-05 DIAGNOSIS — Z86010 Personal history of colonic polyps: Secondary | ICD-10-CM

## 2020-07-06 LAB — SARS CORONAVIRUS 2 (TAT 6-24 HRS): SARS Coronavirus 2: NEGATIVE

## 2020-07-07 ENCOUNTER — Ambulatory Visit (HOSPITAL_COMMUNITY)
Admission: RE | Admit: 2020-07-07 | Discharge: 2020-07-07 | Disposition: A | Discharge status: Home / Self Care | Payer: Medicare Other | Attending: Internal Medicine | Admitting: Internal Medicine

## 2020-07-07 ENCOUNTER — Ambulatory Visit (HOSPITAL_COMMUNITY): Payer: Medicare Other | Admitting: Anesthesiology

## 2020-07-07 ENCOUNTER — Encounter (HOSPITAL_COMMUNITY): Payer: Self-pay | Admitting: Internal Medicine

## 2020-07-07 ENCOUNTER — Encounter (HOSPITAL_COMMUNITY)
Admission: RE | Disposition: A | Discharge status: Home / Self Care | Payer: Self-pay | Source: Home / Self Care | Attending: Internal Medicine

## 2020-07-07 DIAGNOSIS — I10 Essential (primary) hypertension: Secondary | ICD-10-CM | POA: Diagnosis not present

## 2020-07-07 DIAGNOSIS — Z8249 Family history of ischemic heart disease and other diseases of the circulatory system: Secondary | ICD-10-CM | POA: Diagnosis not present

## 2020-07-07 DIAGNOSIS — Z888 Allergy status to other drugs, medicaments and biological substances status: Secondary | ICD-10-CM | POA: Insufficient documentation

## 2020-07-07 DIAGNOSIS — Z7982 Long term (current) use of aspirin: Secondary | ICD-10-CM | POA: Diagnosis not present

## 2020-07-07 DIAGNOSIS — Z79899 Other long term (current) drug therapy: Secondary | ICD-10-CM | POA: Diagnosis not present

## 2020-07-07 DIAGNOSIS — D123 Benign neoplasm of transverse colon: Secondary | ICD-10-CM | POA: Diagnosis not present

## 2020-07-07 DIAGNOSIS — Z08 Encounter for follow-up examination after completed treatment for malignant neoplasm: Secondary | ICD-10-CM | POA: Insufficient documentation

## 2020-07-07 DIAGNOSIS — K219 Gastro-esophageal reflux disease without esophagitis: Secondary | ICD-10-CM | POA: Insufficient documentation

## 2020-07-07 DIAGNOSIS — Z09 Encounter for follow-up examination after completed treatment for conditions other than malignant neoplasm: Secondary | ICD-10-CM | POA: Diagnosis not present

## 2020-07-07 DIAGNOSIS — K644 Residual hemorrhoidal skin tags: Secondary | ICD-10-CM | POA: Insufficient documentation

## 2020-07-07 DIAGNOSIS — Z808 Family history of malignant neoplasm of other organs or systems: Secondary | ICD-10-CM | POA: Insufficient documentation

## 2020-07-07 DIAGNOSIS — K5730 Diverticulosis of large intestine without perforation or abscess without bleeding: Secondary | ICD-10-CM | POA: Diagnosis not present

## 2020-07-07 DIAGNOSIS — Z90710 Acquired absence of both cervix and uterus: Secondary | ICD-10-CM | POA: Diagnosis not present

## 2020-07-07 DIAGNOSIS — Z1211 Encounter for screening for malignant neoplasm of colon: Secondary | ICD-10-CM | POA: Diagnosis not present

## 2020-07-07 DIAGNOSIS — Z86010 Personal history of colonic polyps: Secondary | ICD-10-CM | POA: Diagnosis not present

## 2020-07-07 DIAGNOSIS — Z800 Family history of malignant neoplasm of digestive organs: Secondary | ICD-10-CM | POA: Diagnosis not present

## 2020-07-07 DIAGNOSIS — Z85048 Personal history of other malignant neoplasm of rectum, rectosigmoid junction, and anus: Secondary | ICD-10-CM | POA: Insufficient documentation

## 2020-07-07 DIAGNOSIS — E785 Hyperlipidemia, unspecified: Secondary | ICD-10-CM | POA: Diagnosis not present

## 2020-07-07 DIAGNOSIS — Z8042 Family history of malignant neoplasm of prostate: Secondary | ICD-10-CM | POA: Diagnosis not present

## 2020-07-07 DIAGNOSIS — R002 Palpitations: Secondary | ICD-10-CM | POA: Diagnosis not present

## 2020-07-07 DIAGNOSIS — Z8719 Personal history of other diseases of the digestive system: Secondary | ICD-10-CM | POA: Insufficient documentation

## 2020-07-07 DIAGNOSIS — K635 Polyp of colon: Secondary | ICD-10-CM | POA: Diagnosis not present

## 2020-07-07 HISTORY — PX: POLYPECTOMY: SHX5525

## 2020-07-07 HISTORY — PX: COLONOSCOPY WITH PROPOFOL: SHX5780

## 2020-07-07 SURGERY — COLONOSCOPY WITH PROPOFOL
Anesthesia: General

## 2020-07-07 MED ORDER — PROPOFOL 500 MG/50ML IV EMUL
INTRAVENOUS | Status: DC | PRN
  Administered 2020-07-07: 150 ug/kg/min via INTRAVENOUS

## 2020-07-07 MED ORDER — LIDOCAINE HCL (CARDIAC) PF 100 MG/5ML IV SOSY
PREFILLED_SYRINGE | INTRAVENOUS | Status: DC | PRN
  Administered 2020-07-07: 50 mg via INTRATRACHEAL

## 2020-07-07 MED ORDER — CHLORHEXIDINE GLUCONATE CLOTH 2 % EX PADS: 6.0000 | MEDICATED_PAD | Freq: Once | CUTANEOUS | Status: DC

## 2020-07-07 MED ORDER — STERILE WATER FOR IRRIGATION IR SOLN
Status: DC | PRN
  Administered 2020-07-07: 100 mL

## 2020-07-07 MED ORDER — KETAMINE HCL 10 MG/ML IJ SOLN
INTRAMUSCULAR | Status: DC | PRN
  Administered 2020-07-07: 15 mg via INTRAVENOUS

## 2020-07-07 MED ORDER — LACTATED RINGERS IV SOLN: INTRAVENOUS | Status: DC

## 2020-07-07 MED ORDER — KETAMINE HCL 50 MG/5ML IJ SOSY
PREFILLED_SYRINGE | INTRAMUSCULAR | Status: AC
  Filled 2020-07-07: qty 5

## 2020-07-07 MED ORDER — PROPOFOL 10 MG/ML IV BOLUS
INTRAVENOUS | Status: DC | PRN
  Administered 2020-07-07: 30 mg via INTRAVENOUS

## 2020-07-07 NOTE — Interval H&P Note (Signed)
Patient interviewed and examined.  She has no complaints. No change in physical examination.  History and Physical Interval Note:  07/07/2020 9:36 AM  Misty Hanson  has presented today for surgery, with the diagnosis of History of colon polyps and incomplete colonoscopy.  The various methods of treatment have been discussed with the patient and family. After consideration of risks, benefits and other options for treatment, the patient has consented to  Procedure(s) with comments: COLONOSCOPY WITH PROPOFOL (N/A) - am as a surgical intervention.  The patient's history has been reviewed, patient examined, no change in status, stable for surgery.  I have reviewed the patient's chart and labs.  Questions were answered to the patient's satisfaction.     [employer]

## 2020-07-07 NOTE — Transfer of Care (Signed)
Immediate Anesthesia Transfer of Care Note  Patient: Misty Hanson  Procedure(s) Performed: COLONOSCOPY WITH PROPOFOL (N/A ) POLYPECTOMY  Patient Location: PACU  Anesthesia Type:General  Level of Consciousness: awake  Airway & Oxygen Therapy: Patient Spontanous Breathing  Post-op Assessment: Report given to RN and Post -op Vital signs reviewed and stable  Post vital signs: Reviewed and stable  Last Vitals:  Vitals Value Taken Time  BP    Temp    Pulse    Resp    SpO2      Last Pain:  Vitals:   07/07/20 0941  TempSrc:   PainSc: 0-No pain      Patients Stated Pain Goal: 7 ([id number])  Complications: No complications documented.

## 2020-07-07 NOTE — Anesthesia Preprocedure Evaluation (Addendum)
Anesthesia Evaluation  Patient identified by MRN, date of birth, ID band Patient awake    Reviewed: Allergy & Precautions, NPO status , Patient's Chart, lab work & pertinent test results, reviewed documented beta blocker date and time   History of Anesthesia Complications (+) PONV and history of anesthetic complications  Airway Mallampati: I  TM Distance: >3 FB Neck ROM: Full   Comment: ACDF Dental  (+) Dental Advisory Given, Teeth Intact   Pulmonary neg pulmonary ROS,    Pulmonary exam normal breath sounds clear to auscultation       Cardiovascular hypertension, Pt. on home beta blockers and Pt. on medications Normal cardiovascular exam+ dysrhythmias (Palpitations)  Rhythm:Regular Rate:Normal     Neuro/Psych negative neurological ROS  negative psych ROS   GI/Hepatic Neg liver ROS, hiatal hernia,   Endo/Other  negative endocrine ROS  Renal/GU negative Renal ROS     Musculoskeletal  (+) Arthritis  (ACDF),   Abdominal   Peds  Hematology negative hematology ROS (+)   Anesthesia Other Findings   Reproductive/Obstetrics negative OB ROS                             Anesthesia Physical Anesthesia Plan  ASA: II  Anesthesia Plan: General   Post-op Pain Management:    Induction: Intravenous  PONV Risk Score and Plan: Propofol infusion  Airway Management Planned: Nasal Cannula and Natural Airway  Additional Equipment:   Intra-op Plan:   Post-operative Plan:   Informed Consent: I have reviewed the patients History and Physical, chart, labs and discussed the procedure including the risks, benefits and alternatives for the proposed anesthesia with the patient or authorized representative who has indicated his/her understanding and acceptance.     Dental advisory given  Plan Discussed with: CRNA and Surgeon  Anesthesia Plan Comments:         Anesthesia Quick Evaluation

## 2020-07-07 NOTE — Op Note (Addendum)
[facility] Patient Name: Misty Hanson Procedure Date: 07/07/2020 9:24 AM MRN: [id number] Date of Birth: [birth year] Attending MD: Hildred Laser , MD CSN: [id number] Age: 75 Admit Type: Outpatient Procedure:                Colonoscopy Indications:              High risk colon cancer surveillance: Personal                            history of colonic polyps; last exam incomplete. Providers:                Hildred Laser, MD, Rosina Lowenstein, RN, Raphael Gibney,                            Technician Referring MD:             Adelina Mings, MD and Jerene Bears, MD Medicines:                Propofol per Anesthesia Complications:            No immediate complications. Estimated Blood Loss:     Estimated blood loss was minimal. Procedure:                Pre-Anesthesia Assessment:                           - Prior to the procedure, a History and Physical                            was performed, and patient medications and                            allergies were reviewed. The patient's tolerance of                            previous anesthesia was also reviewed. The risks                            and benefits of the procedure and the sedation                            options and risks were discussed with the patient.                            All questions were answered, and informed consent                            was obtained. Prior Anticoagulants: The patient has                            taken no previous anticoagulant or antiplatelet                            agents. ASA Grade Assessment: II - A patient with  mild systemic disease. After reviewing the risks                            and benefits, the patient was deemed in                            satisfactory condition to undergo the procedure.                           After obtaining informed consent, the colonoscope                            was passed under direct vision. Throughout the                             procedure, the patient's blood pressure, pulse, and                            oxygen saturations were monitored continuously. The                            [id number]-[id number] ([id number]) scope was introduced through                            the anus and advanced to the the cecum, identified                            by appendiceal orifice and ileocecal valve. The                            colonoscopy was technically difficult and complex                            due to a redundant colon and significant looping.                            Successful completion of the procedure was aided by                            changing the patient to a supine position, using                            manual pressure and withdrawing the scope and                            replacing with the 'babyscope'. The patient                            tolerated the procedure well. The quality of the                            bowel preparation was excellent. The ileocecal  valve, appendiceal orifice, and rectum were                            photographed. Scope In: 9:47:35 AM Scope Out: 10:32:47 AM Scope Withdrawal Time: 0 hours 7 minutes 49 seconds  Total Procedure Duration: 0 hours 45 minutes 12 seconds  Findings:      The perianal and digital rectal examinations were normal.      Scattered diverticula were found in the sigmoid colon, splenic flexure,       hepatic flexure and ascending colon.      A small polyp was found in the splenic flexure. The polyp was sessile.      External hemorrhoids were found during retroflexion. The hemorrhoids       were small. Impression:               - Diverticulosis in the sigmoid colon, at the                            splenic flexure, at the hepatic flexure and in the                            ascending colon.                           - One small polyp at the splenic flexure.                           - External  hemorrhoids.                           - No specimens collected.                           Comment: Very redundant sigmoid and transverse                            colon. Examination was started with pediatric scope                            and completed with ultraslim scope. Moderate Sedation:      Per Anesthesia Care Recommendation:           - Patient has a contact number available for                            emergencies. The signs and symptoms of potential                            delayed complications were discussed with the                            patient. Return to normal activities tomorrow.                            Written discharge instructions were provided to the  patient.                           - High fiber diet today.                           - Continue present medications.                           - No aspirin, ibuprofen, naproxen, or other                            non-steroidal anti-inflammatory drugs for 1 day.                           - Await pathology results.                           - Repeat colonoscopy is recommended. The                            colonoscopy date will be determined after pathology                            results from today's exam become available for                            review. Procedure Code(s):        --- Professional ---                           [phone], Colonoscopy, flexible; diagnostic, including                            collection of specimen(s) by brushing or washing,                            when performed (separate procedure) Diagnosis Code(s):        --- Professional ---                           Z86.010, Personal history of colonic polyps                           K64.4, Residual hemorrhoidal skin tags                           K63.5, Polyp of colon                           K57.30, Diverticulosis of large intestine without                            perforation or abscess  without bleeding CPT copyright 2019 American Medical Association. All rights reserved. The codes documented in this report are preliminary and upon coder review may  be revised to meet current compliance requirements. Hildred Laser, MD Hildred Laser, MD  07/07/2020 10:44:58 AM This report has been signed electronically. Number of Addenda: 1 Addendum Number: 1   Addendum Date: 07/13/2020 8:47:00 AM      Polyp was cold snared. Polypectomy complete. Hildred Laser, MD Hildred Laser, MD 07/13/2020 8:47:49 AM This report has been signed electronically.

## 2020-07-07 NOTE — Discharge Instructions (Signed)
No aspirin or NSAIDs for 24 hours Resume other medications as before High-fiber diet. No driving for 24 hours. Physician will call with biopsy results. PATIENT INSTRUCTIONS POST-ANESTHESIA  IMMEDIATELY FOLLOWING SURGERY:  Do not drive or operate machinery for the first twenty four hours after surgery.  Do not make any important decisions for twenty four hours after surgery or while taking narcotic pain medications or sedatives.  If you develop intractable nausea and vomiting or a severe headache please notify your doctor immediately.  FOLLOW-UP:  Please make an appointment with your surgeon as instructed. You do not need to follow up with anesthesia unless specifically instructed to do so.  WOUND CARE INSTRUCTIONS (if applicable):  Keep a dry clean dressing on the anesthesia/puncture wound site if there is drainage.  Once the wound has quit draining you may leave it open to air.  Generally you should leave the bandage intact for twenty four hours unless there is drainage.  If the epidural site drains for more than 36-48 hours please call the anesthesia department.  QUESTIONS?:  Please feel free to call your physician or the hospital operator if you have any questions, and they will be happy to assist you.      Colonoscopy, Adult, Care After This sheet gives you information about how to care for yourself after your procedure. Your health care provider may also give you more specific instructions. If you have problems or questions, contact your health care provider. What can I expect after the procedure? After the procedure, it is common to have:  A small amount of blood in your stool for 24 hours after the procedure.  Some gas.  Mild cramping or bloating of your abdomen. Follow these instructions at home: Eating and drinking  Drink enough fluid to keep your urine pale yellow.  Follow instructions from your health care provider about eating or drinking restrictions.  Resume your  normal diet as instructed by your health care provider. Avoid heavy or fried foods that are hard to digest.   Activity  Rest as told by your health care provider.  Avoid sitting for a long time without moving. Get up to take short walks every 1-2 hours. This is important to improve blood flow and breathing. Ask for help if you feel weak or unsteady.  Return to your normal activities as told by your health care provider. Ask your health care provider what activities are safe for you. Managing cramping and bloating  Try walking around when you have cramps or feel bloated.  Apply heat to your abdomen as told by your health care provider. Use the heat source that your health care provider recommends, such as a moist heat pack or a heating pad. ? Place a towel between your skin and the heat source. ? Leave the heat on for 20-30 minutes. ? Remove the heat if your skin turns bright red. This is especially important if you are unable to feel pain, heat, or cold. You may have a greater risk of getting burned.   General instructions  If you were given a sedative during the procedure, it can affect you for several hours. Do not drive or operate machinery until your health care provider says that it is safe.  For the first 24 hours after the procedure: ? Do not sign important documents. ? Do not drink alcohol. ? Do your regular daily activities at a slower pace than normal. ? Eat soft foods that are easy to digest.  Take over-the-counter and  prescription medicines only as told by your health care provider.  Keep all follow-up visits as told by your health care provider. This is important. Contact a health care provider if:  You have blood in your stool 2-3 days after the procedure. Get help right away if you have:  More than a small spotting of blood in your stool.  Large blood clots in your stool.  Swelling of your abdomen.  Nausea or vomiting.  A fever.  Increasing pain in your  abdomen that is not relieved with medicine. Summary  After the procedure, it is common to have a small amount of blood in your stool. You may also have mild cramping and bloating of your abdomen.  If you were given a sedative during the procedure, it can affect you for several hours. Do not drive or operate machinery until your health care provider says that it is safe.  Get help right away if you have a lot of blood in your stool, nausea or vomiting, a fever, or increased pain in your abdomen. This information is not intended to replace advice given to you by your health care provider. Make sure you discuss any questions you have with your health care provider. Document Revised: 04/11/2019 Document Reviewed: 11/11/2018 Elsevier Patient Education  [place].

## 2020-07-07 NOTE — Anesthesia Postprocedure Evaluation (Signed)
Anesthesia Post Note  Patient: Misty Hanson  Procedure(s) Performed: COLONOSCOPY WITH PROPOFOL (N/A ) POLYPECTOMY  Patient location during evaluation: Phase II Anesthesia Type: General Level of consciousness: awake Pain management: pain level controlled Vital Signs Assessment: post-procedure vital signs reviewed and stable Respiratory status: spontaneous breathing Cardiovascular status: stable Postop Assessment: no apparent nausea or vomiting Anesthetic complications: no   No complications documented.   Last Vitals:  Vitals:   07/07/20 0905 07/07/20 1045  BP: (!) 176/63 111/60  Resp: 17 19  Temp: 36.8 C 36.7 C  SpO2: 100% 100%    Last Pain:  Vitals:   07/07/20 1045  TempSrc: Oral  PainSc: 2                  Waylon Hershey Hristova

## 2020-07-08 LAB — SURGICAL PATHOLOGY

## 2020-07-09 NOTE — Progress Notes (Signed)
Results reviewed with patient yesterday afternoon Polyp is tubular adenoma She had tubular adenoma removed in September 2020 had incomplete colonoscopy If she remains in good health she can consider another exam in 7 years. Will arrange office visit prior to next procedure  Reports to PCP and Dr. Adelina Mings

## 2020-07-13 ENCOUNTER — Encounter (HOSPITAL_COMMUNITY): Payer: Self-pay | Admitting: Internal Medicine

## 2020-10-01 DIAGNOSIS — Z299 Encounter for prophylactic measures, unspecified: Secondary | ICD-10-CM | POA: Diagnosis not present

## 2020-10-01 DIAGNOSIS — I10 Essential (primary) hypertension: Secondary | ICD-10-CM | POA: Diagnosis not present

## 2020-10-01 DIAGNOSIS — L03039 Cellulitis of unspecified toe: Secondary | ICD-10-CM | POA: Diagnosis not present

## 2020-10-15 DIAGNOSIS — E7800 Pure hypercholesterolemia, unspecified: Secondary | ICD-10-CM | POA: Diagnosis not present

## 2020-10-15 DIAGNOSIS — R5383 Other fatigue: Secondary | ICD-10-CM | POA: Diagnosis not present

## 2020-10-15 DIAGNOSIS — U071 COVID-19: Secondary | ICD-10-CM | POA: Diagnosis not present

## 2020-10-15 DIAGNOSIS — Z299 Encounter for prophylactic measures, unspecified: Secondary | ICD-10-CM | POA: Diagnosis not present

## 2020-10-21 DIAGNOSIS — Z299 Encounter for prophylactic measures, unspecified: Secondary | ICD-10-CM | POA: Diagnosis not present

## 2020-10-21 DIAGNOSIS — I10 Essential (primary) hypertension: Secondary | ICD-10-CM | POA: Diagnosis not present

## 2020-10-24 DIAGNOSIS — R9431 Abnormal electrocardiogram [ECG] [EKG]: Secondary | ICD-10-CM | POA: Insufficient documentation

## 2020-10-24 NOTE — Progress Notes (Deleted)
Cardiology Office Note   Date:  10/24/2020   ID:  Misty Hanson, Misty Hanson November 12, 1945, MRN [id number]  PCP:  Misty Chroman, MD  Cardiologist:   Misty Breeding, MD  No chief complaint on file.     History of Present Illness:  Misty Hanson is a 75 y.o. female who presents for evaluation of palpitations.    Since I last saw her  ***   ***  she has had no new symptoms. The patient denies any new symptoms such as chest discomfort, neck or arm discomfort. There has been no new shortness of breath, PND or orthopnea. There have been no reported palpitations, presyncope or syncope.  She had a tough year socially and she is lost 2 brothers.  She is taking care of a sick sister.  She still able to be active.  She walks.  She does her chores. The patient denies any new symptoms such as chest discomfort, neck or arm discomfort. There has been no new shortness of breath, PND or orthopnea. There have been no reported palpitations, presyncope or syncope.    Past Medical History:  Diagnosis Date   Arthritis    Diverticulosis    Dysrhythmia    unknown arrythmia   H/O hiatal hernia    Hyperlipidemia    takes Fish Oil daily   Hypertension    takes Metoprolol daily   IBS (irritable bowel syndrome)    Neck pain    HNP   PONV (postoperative nausea and vomiting)     Past Surgical History:  Procedure Laterality Date   ABDOMINAL HYSTERECTOMY     ANTERIOR CERVICAL DECOMP/DISCECTOMY FUSION  07/10/2011   Procedure: ANTERIOR CERVICAL DECOMPRESSION/DISCECTOMY FUSION 2 LEVELS;  Surgeon: Misty Killings, MD;  Location: [facility];  Service: Orthopedics;  Laterality: N/A;  C5-6, C6-7 Anterior Cervical Discectomy and Fusion, allograft, plate   APPENDECTOMY     CESAREAN SECTION     COLONOSCOPY     COLONOSCOPY WITH PROPOFOL N/A 07/07/2020   Procedure: COLONOSCOPY WITH PROPOFOL;  Surgeon: Misty Houston, MD;  [facility];  Service: Endoscopy;  Laterality: N/A;  am   ESOPHAGOGASTRODUODENOSCOPY      POLYPECTOMY  07/07/2020   Procedure: POLYPECTOMY;  Surgeon: Misty Houston, MD;  [facility];  Service: Endoscopy;;     Current Outpatient Medications  Medication Sig Dispense Refill   amLODipine (NORVASC) 2.5 MG tablet Take 1 tablet (2.5 mg total) by mouth daily. 90 tablet 2   aspirin 81 MG tablet Take 1 tablet (81 mg total) by mouth daily. 30 tablet    Biotin w/ Vitamins C & E (HAIR SKIN & NAILS GUMMIES PO) Take 2 tablets by mouth daily.     Coenzyme Q10 (CO Q 10 PO) Take 400 mg by mouth daily.     esomeprazole (NEXIUM) 20 MG capsule Take 20 mg by mouth daily at 12 noon.     estradiol (CLIMARA) 0.06 MG/24HR Place 1 patch onto the skin every Saturday.     hydroxypropyl methylcellulose / hypromellose (ISOPTO TEARS / GONIOVISC) 2.5 % ophthalmic solution Place 1 drop into both eyes 3 (three) times daily as needed for dry eyes.     metoprolol succinate (TOPROL-XL) 25 MG 24 hr tablet Take 12.5 mg by mouth in the morning and at bedtime.     Omega-3 Fatty Acids (FISH OIL) 1200 MG CAPS Take 1,200 mg by mouth daily.     pravastatin (PRAVACHOL) 40 MG tablet Take 40  mg by mouth every evening.     No current facility-administered medications for this visit.    Allergies:   Cortisone, Prednisone, and Caffeine    ROS:  Please see the history of present illness.   Otherwise, review of systems are positive for ***.   All other systems are reviewed and negative.    PHYSICAL EXAM: VS:  There were no vitals taken for this visit. , BMI There is no height or weight on file to calculate BMI. GENERAL:  Well appearing NECK:  No jugular venous distention, waveform within normal limits, carotid upstroke brisk and symmetric, no bruits, no thyromegaly LUNGS:  Clear to auscultation bilaterally CHEST:  Unremarkable HEART:  PMI not displaced or sustained,S1 and S2 within normal limits, no S3, no S4, no clicks, no rubs, *** murmurs ABD:  Flat, positive bowel sounds normal in frequency in pitch, no  bruits, no rebound, no guarding, no midline pulsatile mass, no hepatomegaly, no splenomegaly EXT:  2 plus pulses throughout, no edema, no cyanosis no clubbing    *** GENERAL:  Well appearing NECK:  No jugular venous distention, waveform within normal limits, carotid upstroke brisk and symmetric, no bruits, no thyromegaly LUNGS:  Clear to auscultation bilaterally CHEST:  Unremarkable HEART:  PMI not displaced or sustained,S1 and S2 within normal limits, no S3, no S4, no clicks, no rubs, no murmurs ABD:  Flat, positive bowel sounds normal in frequency in pitch, no bruits, no rebound, no guarding, no midline pulsatile mass, no hepatomegaly, no splenomegaly EXT:  2 plus pulses throughout, no edema, no cyanosis no clubbing    EKG:  EKG is *** ordered today. The ekg ordered today demonstrates sinus rhythm, rate ***, axis within normal limits, intervals within normal limits, poor anterior R wave progression, no acute ST-T wave changes.   Recent Labs: No results found for requested labs within last 8760 hours.    Lipid Panel No results found for: CHOL, TRIG, HDL, CHOLHDL, VLDL, LDLCALC, LDLDIRECT    Wt Readings from Last 3 Encounters:  07/07/20 146 lb (66.2 kg)  06/22/20 146 lb 6.4 oz (66.4 kg)  09/03/19 145 lb (65.8 kg)      Other studies Reviewed: Additional studies/ records that were reviewed today include: ***. Review of the above records demonstrates:   ***   ASSESSMENT AND PLAN:    PALPITATIONS:   ***  She is no longer having palpitations.  No change in therapy.  She will continue the low-dose beta-blocker.  HTN:   The blood pressure is *** elevated today but she says this is quite unusual.  No change in therapies.  She will keep a blood pressure diary which she does at home when she sits in the 130s with 60.  H  ABNORMAL EKG:  ***  The EKG does demonstrate poor anterior R wave progression.  I do not see any evidence of a prior myocardial infarction on her previous echo.   No further work-up.   Current medicines are reviewed at length with the patient today.  The patient does not have concerns regarding medicines.  The following changes have been made:  ***  Labs/ tests ordered today include:   ***  No orders of the defined types were placed in this encounter.    Disposition:   FU with me in one year.     Signed, Misty Breeding, MD  10/24/2020 8:20 PM    [facility]

## 2020-10-27 ENCOUNTER — Ambulatory Visit: Payer: Medicare Other | Admitting: Cardiology

## 2020-11-25 ENCOUNTER — Other Ambulatory Visit: Payer: Self-pay | Admitting: Cardiology

## 2021-01-04 DIAGNOSIS — Z0000 Encounter for general adult medical examination without abnormal findings: Secondary | ICD-10-CM | POA: Diagnosis not present

## 2021-01-04 DIAGNOSIS — Z79899 Other long term (current) drug therapy: Secondary | ICD-10-CM | POA: Diagnosis not present

## 2021-01-04 DIAGNOSIS — Z789 Other specified health status: Secondary | ICD-10-CM | POA: Diagnosis not present

## 2021-01-04 DIAGNOSIS — E7800 Pure hypercholesterolemia, unspecified: Secondary | ICD-10-CM | POA: Diagnosis not present

## 2021-01-04 DIAGNOSIS — Z7189 Other specified counseling: Secondary | ICD-10-CM | POA: Diagnosis not present

## 2021-01-04 DIAGNOSIS — Z299 Encounter for prophylactic measures, unspecified: Secondary | ICD-10-CM | POA: Diagnosis not present

## 2021-01-04 DIAGNOSIS — I10 Essential (primary) hypertension: Secondary | ICD-10-CM | POA: Diagnosis not present

## 2021-01-04 DIAGNOSIS — R5383 Other fatigue: Secondary | ICD-10-CM | POA: Diagnosis not present

## 2021-02-01 NOTE — Progress Notes (Signed)
Cardiology Office Note   Date:  02/02/2021   ID:  Kemia, Wendel [birth year], MRN [id number]  PCP:  Glenda Chroman, MD  Cardiologist:   Minus Breeding, MD  Chief Complaint  Patient presents with   Palpitations       History of Present Illness:  Misty Hanson is a 75 y.o. female who presents for evaluation of palpitations.    Since I last saw her she has done well.  She really does not feel the palpitations that she had 3.  She thinks is because she is sleeping still having at night.  She is very busy still taking care of her sister with Parkinson's disease.  Her daughter has Crohn's in 2 children that she helps with sometimes.  Her daughter-in-law is a physician next-door.  Patient's husband has cancer and she is caring for him. The patient denies any new symptoms such as chest discomfort, neck or arm discomfort. There has been no new shortness of breath, PND or orthopnea. There have been no reported palpitations, presyncope or syncope   Past Medical History:  Diagnosis Date   Arthritis    Diverticulosis    Dysrhythmia    unknown arrythmia   H/O hiatal hernia    Hyperlipidemia    takes Fish Oil daily   Hypertension    takes Metoprolol daily   IBS (irritable bowel syndrome)    Neck pain    HNP   PONV (postoperative nausea and vomiting)     Past Surgical History:  Procedure Laterality Date   ABDOMINAL HYSTERECTOMY     ANTERIOR CERVICAL DECOMP/DISCECTOMY FUSION  07/10/2011   Procedure: ANTERIOR CERVICAL DECOMPRESSION/DISCECTOMY FUSION 2 LEVELS;  Surgeon: Marybelle Killings, MD;  Location: [facility];  Service: Orthopedics;  Laterality: N/A;  C5-6, C6-7 Anterior Cervical Discectomy and Fusion, allograft, plate   APPENDECTOMY     CESAREAN SECTION     COLONOSCOPY     COLONOSCOPY WITH PROPOFOL N/A 07/07/2020   Procedure: COLONOSCOPY WITH PROPOFOL;  Surgeon: Rogene Houston, MD;  [facility];  Service: Endoscopy;  Laterality: N/A;  am   ESOPHAGOGASTRODUODENOSCOPY      POLYPECTOMY  07/07/2020   Procedure: POLYPECTOMY;  Surgeon: Rogene Houston, MD;  [facility];  Service: Endoscopy;;     Current Outpatient Medications  Medication Sig Dispense Refill   amLODipine (NORVASC) 2.5 MG tablet TAKE 1 TABLET BY MOUTH  DAILY 90 tablet 3   aspirin 81 MG tablet Take 1 tablet (81 mg total) by mouth daily. 30 tablet    Biotin w/ Vitamins C & E (HAIR SKIN & NAILS GUMMIES PO) Take 2 tablets by mouth daily.     Coenzyme Q10 (CO Q 10 PO) Take 400 mg by mouth daily.     esomeprazole (NEXIUM) 20 MG capsule Take 20 mg by mouth daily at 12 noon.     estradiol (CLIMARA) 0.06 MG/24HR Place 1 patch onto the skin every Saturday.     hydroxypropyl methylcellulose / hypromellose (ISOPTO TEARS / GONIOVISC) 2.5 % ophthalmic solution Place 1 drop into both eyes 3 (three) times daily as needed for dry eyes.     metoprolol succinate (TOPROL-XL) 25 MG 24 hr tablet Take 12.5 mg by mouth in the morning and at bedtime.     Omega-3 Fatty Acids (FISH OIL) 1200 MG CAPS Take 1,200 mg by mouth daily.     pravastatin (PRAVACHOL) 40 MG tablet Take 40 mg by mouth every evening.  No current facility-administered medications for this visit.    Allergies:   Cortisone, Prednisone, and Caffeine    ROS:  Please see the history of present illness.   Otherwise, review of systems are positive for none.   All other systems are reviewed and negative.    PHYSICAL EXAM: VS:  BP (!) 152/70   Pulse 70   Ht 5\' 2"  (1.575 m)   Wt 142 lb (64.4 kg)   BMI 25.97 kg/m  , BMI Body mass index is 25.97 kg/m. GENERAL:  Well appearing NECK:  No jugular venous distention, waveform within normal limits, carotid upstroke brisk and symmetric, no bruits, no thyromegaly LUNGS:  Clear to auscultation bilaterally CHEST:  Unremarkable HEART:  PMI not displaced or sustained,S1 and S2 within normal limits, no S3, no S4, no clicks, no rubs, no murmurs ABD:  Flat, positive bowel sounds normal in frequency in  pitch, no bruits, no rebound, no guarding, no midline pulsatile mass, no hepatomegaly, no splenomegaly EXT:  2 plus pulses throughout, no edema, no cyanosis no clubbing   EKG:  EKG is  ordered today. The ekg ordered today demonstrates sinus rhythm, rate 70, axis within normal limits, intervals within normal limits, poor anterior R wave progression, no acute ST-T wave changes.   Recent Labs: No results found for requested labs within last 8760 hours.    Lipid Panel No results found for: CHOL, TRIG, HDL, CHOLHDL, VLDL, LDLCALC, LDLDIRECT    Wt Readings from Last 3 Encounters:  02/02/21 142 lb (64.4 kg)  07/07/20 146 lb (66.2 kg)  06/22/20 146 lb 6.4 oz (66.4 kg)      Other studies Reviewed: Additional studies/ records that were reviewed today include: None. Review of the above records demonstrates: NA   ASSESSMENT AND PLAN:    PALPITATIONS:    She is not having any symptomatic tachypalpitations on the low-dose beta-blocker.  No change in therapy.   HTN:   The blood pressure is high but she says it is in the 130s at home and her daughter-in-law can check it for her to keep an eye on it.  It does go up I would increase her amlodipine but for now she will remain on the dose as listed.       Current medicines are reviewed at length with the patient today.  The patient does not have concerns regarding medicines.  The following changes have been made: None  Labs/ tests ordered today include: None  Orders Placed This Encounter  Procedures   EKG 12-Lead      Disposition:   FU with me in 1 year per patient request   Signed, Minus Breeding, MD  02/02/2021 2:23 PM    [place]

## 2021-02-02 ENCOUNTER — Encounter: Payer: Self-pay | Admitting: Cardiology

## 2021-02-02 ENCOUNTER — Other Ambulatory Visit: Payer: Self-pay

## 2021-02-02 ENCOUNTER — Ambulatory Visit (INDEPENDENT_AMBULATORY_CARE_PROVIDER_SITE_OTHER): Payer: Medicare Other | Admitting: Cardiology

## 2021-02-02 VITALS — BP 152/70 | HR 70 | Ht 62.0 in | Wt 142.0 lb

## 2021-02-02 DIAGNOSIS — R9431 Abnormal electrocardiogram [ECG] [EKG]: Secondary | ICD-10-CM | POA: Diagnosis not present

## 2021-02-02 DIAGNOSIS — I10 Essential (primary) hypertension: Secondary | ICD-10-CM | POA: Diagnosis not present

## 2021-02-02 DIAGNOSIS — R002 Palpitations: Secondary | ICD-10-CM

## 2021-02-02 NOTE — Patient Instructions (Signed)
Medication Instructions:  The current medical regimen is effective;  continue present plan and medications.  *If you need a refill on your cardiac medications before your next appointment, please call your pharmacy*  Follow-Up: At [facility], you and your health needs are our priority.  As part of our continuing mission to provide you with exceptional heart care, we have created designated Provider Care Teams.  These Care Teams include your primary Cardiologist (physician) and Advanced Practice Providers (APPs -  Physician Assistants and Nurse Practitioners) who all work together to provide you with the care you need, when you need it.  We recommend signing up for the patient portal called "MyChart".  Sign up information is provided on this After Visit Summary.  MyChart is used to connect with patients for Virtual Visits (Telemedicine).  Patients are able to view lab/test results, encounter notes, upcoming appointments, etc.  Non-urgent messages can be sent to your provider as well.   To learn more about what you can do with MyChart, go to [email or web address].    Your next appointment:   1 year(s)  The format for your next appointment:   In Person  Provider:   James Hochrein, MD   Thank you for choosing [facility]!!    

## 2021-02-16 DIAGNOSIS — Z23 Encounter for immunization: Secondary | ICD-10-CM | POA: Diagnosis not present

## 2021-07-14 DIAGNOSIS — L821 Other seborrheic keratosis: Secondary | ICD-10-CM | POA: Diagnosis not present

## 2021-07-14 DIAGNOSIS — L570 Actinic keratosis: Secondary | ICD-10-CM | POA: Diagnosis not present

## 2021-07-14 DIAGNOSIS — X32XXXA Exposure to sunlight, initial encounter: Secondary | ICD-10-CM | POA: Diagnosis not present

## 2021-07-15 DIAGNOSIS — Z1231 Encounter for screening mammogram for malignant neoplasm of breast: Secondary | ICD-10-CM | POA: Diagnosis not present

## 2021-10-12 ENCOUNTER — Other Ambulatory Visit: Payer: Self-pay | Admitting: Cardiology

## 2021-10-13 DIAGNOSIS — D225 Melanocytic nevi of trunk: Secondary | ICD-10-CM | POA: Diagnosis not present

## 2021-10-13 DIAGNOSIS — Z1283 Encounter for screening for malignant neoplasm of skin: Secondary | ICD-10-CM | POA: Diagnosis not present

## 2022-01-10 DIAGNOSIS — Z789 Other specified health status: Secondary | ICD-10-CM | POA: Diagnosis not present

## 2022-01-10 DIAGNOSIS — Z79899 Other long term (current) drug therapy: Secondary | ICD-10-CM | POA: Diagnosis not present

## 2022-01-10 DIAGNOSIS — Z7189 Other specified counseling: Secondary | ICD-10-CM | POA: Diagnosis not present

## 2022-01-10 DIAGNOSIS — Z0000 Encounter for general adult medical examination without abnormal findings: Secondary | ICD-10-CM | POA: Diagnosis not present

## 2022-01-10 DIAGNOSIS — R5383 Other fatigue: Secondary | ICD-10-CM | POA: Diagnosis not present

## 2022-01-10 DIAGNOSIS — Z299 Encounter for prophylactic measures, unspecified: Secondary | ICD-10-CM | POA: Diagnosis not present

## 2022-01-10 DIAGNOSIS — E559 Vitamin D deficiency, unspecified: Secondary | ICD-10-CM | POA: Diagnosis not present

## 2022-01-10 DIAGNOSIS — E7800 Pure hypercholesterolemia, unspecified: Secondary | ICD-10-CM | POA: Diagnosis not present

## 2022-01-10 DIAGNOSIS — Z23 Encounter for immunization: Secondary | ICD-10-CM | POA: Diagnosis not present

## 2022-01-10 DIAGNOSIS — I10 Essential (primary) hypertension: Secondary | ICD-10-CM | POA: Diagnosis not present

## 2022-01-24 DIAGNOSIS — Z79899 Other long term (current) drug therapy: Secondary | ICD-10-CM | POA: Diagnosis not present

## 2022-02-07 DIAGNOSIS — Z299 Encounter for prophylactic measures, unspecified: Secondary | ICD-10-CM | POA: Diagnosis not present

## 2022-02-07 DIAGNOSIS — Z789 Other specified health status: Secondary | ICD-10-CM | POA: Diagnosis not present

## 2022-02-07 DIAGNOSIS — I10 Essential (primary) hypertension: Secondary | ICD-10-CM | POA: Diagnosis not present

## 2022-02-07 DIAGNOSIS — Z0000 Encounter for general adult medical examination without abnormal findings: Secondary | ICD-10-CM | POA: Diagnosis not present

## 2022-04-10 ENCOUNTER — Encounter: Payer: Medicare Other | Admitting: Adult Health

## 2022-04-12 ENCOUNTER — Encounter: Payer: Self-pay | Admitting: Obstetrics & Gynecology

## 2022-04-12 ENCOUNTER — Ambulatory Visit: Payer: Medicare Other | Admitting: Obstetrics & Gynecology

## 2022-04-12 VITALS — BP 188/78 | HR 71 | Ht 62.0 in | Wt 148.8 lb

## 2022-04-12 DIAGNOSIS — Z79890 Hormone replacement therapy (postmenopausal): Secondary | ICD-10-CM

## 2022-04-12 DIAGNOSIS — Z90710 Acquired absence of both cervix and uterus: Secondary | ICD-10-CM

## 2022-04-12 MED ORDER — ESTRADIOL 0.05 MG/24HR TD PTWK: 0.0500 mg | MEDICATED_PATCH | TRANSDERMAL | 0 refills | Status: DC

## 2022-04-12 NOTE — Progress Notes (Signed)
   GYN VISIT Patient name: Misty Hanson MRN [id number]  Date of birth: 1946-03-24 Chief Complaint:   Establish Care (Discuss HRT)  History of Present Illness:   Misty Hanson is a 76 y.o. G2P2002 PM, [place] female being seen today for HRT management.  Been on the HRT since '85 initially on pills then on the patch.  She has tried to ween off in the past, but noted heart palpitations and irregular heart beat.    Currently on climara patch weekly and wishes to discontinue.  Per PCP she was advised to follow up with gyn.  She denies hot flashes/night sweats or vaginal dryness.  Reports no acute complaints. .     No LMP recorded. Patient has had a hysterectomy.     04/12/2022    2:21 PM  Depression screen PHQ 2/9  Decreased Interest 0  Down, Depressed, Hopeless 0  PHQ - 2 Score 0  Altered sleeping 0  Tired, decreased energy 1  Change in appetite 0  Feeling bad or failure about yourself  0  Trouble concentrating 0  Moving slowly or fidgety/restless 0  Suicidal thoughts 0  PHQ-9 Score 1     Review of Systems:   Pertinent items are noted in HPI Denies fever/chills, dizziness, headaches, visual disturbances, fatigue, shortness of breath, chest pain, abdominal pain, vomiting. Pertinent History Reviewed:  Reviewed past medical,surgical, social, obstetrical and family history.  Reviewed problem list, medications and allergies. Physical Assessment:   Vitals:   04/12/22 1420 04/12/22 1430  BP: (!) 182/74 (!) 188/78  Pulse: 76 71  Weight: 148 lb 12.8 oz (67.5 kg)   Height: '5\' 2"'$  (1.575 m)   Body mass index is 27.22 kg/m.       Physical Examination:   General appearance: alert, well appearing, and in no distress  Psych: mood appropriate, normal affect  Skin: warm & dry   Cardiovascular: normal heart rate noted, RRR  Respiratory: normal respiratory effort, no distress, CTAB  Abdomen: soft, non-tender   Pelvic: examination not indicated  Extremities: no edema   Chaperone: N/A     Assessment & Plan:  1) HRT -since pt has previously tried to ween at current dose without success will plan for slower taper -plan for 0.05 weekly x 4wks then 0.'0375mg'$  x 4 weeks then 0.'025mg'$  weekly x4wks -pt desires only one prescription at a time '[]'$  will send in Rx as needs (pt to call to request refill)  2) Elevated BP -pt has PCP and knows to follow up   Meds ordered this encounter  Medications   estradiol (CLIMARA) 0.05 mg/24hr patch    Sig: Place 1 patch (0.05 mg total) onto the skin once a week.    Dispense:  4 patch    Refill:  0     Return in about 3 months (around 07/12/2022) for Medication follow up.   Janyth Pupa, DO Attending [place], [facility] for [employer], [place]

## 2022-05-16 ENCOUNTER — Telehealth: Payer: Self-pay

## 2022-05-16 ENCOUNTER — Other Ambulatory Visit: Payer: Self-pay | Admitting: Adult Health

## 2022-05-16 MED ORDER — ESTRADIOL 0.0375 MG/24HR TD PTWK: 0.0375 mg | MEDICATED_PATCH | TRANSDERMAL | 0 refills | Status: DC

## 2022-05-16 NOTE — Progress Notes (Signed)
Rx climara 0.0375  mg patch #4 for Dr Nelda Marseille

## 2022-05-16 NOTE — Telephone Encounter (Signed)
Rx climara 0.0375  mg patch #4 for Dr Nelda Marseille

## 2022-05-16 NOTE — Telephone Encounter (Signed)
PATIENT CALLED AND STATED THAT SHE NEEDS HER ESTRADIOL PATCH SENT IN TO THE PHARMACY.

## 2022-06-05 ENCOUNTER — Telehealth: Payer: Self-pay | Admitting: Obstetrics & Gynecology

## 2022-06-05 NOTE — Telephone Encounter (Signed)
Pt is requesting Rx climara 0.0375  mg patch this would be #3

## 2022-06-06 NOTE — Telephone Encounter (Signed)
Unable to LMOVM

## 2022-06-09 ENCOUNTER — Telehealth: Payer: Self-pay

## 2022-06-09 MED ORDER — ESTRADIOL 0.025 MG/24HR TD PTWK: 0.0250 mg | MEDICATED_PATCH | TRANSDERMAL | 0 refills | Status: DC

## 2022-06-09 NOTE — Telephone Encounter (Signed)
Patient called and stated that she has called 3 times about getting her estradiol patch refilled and has not heard anything. Patient also stated that she needs it before next week because she is going out of town.

## 2022-06-09 NOTE — Telephone Encounter (Signed)
Aware rx sent

## 2022-06-17 ENCOUNTER — Other Ambulatory Visit: Payer: Self-pay | Admitting: Cardiology

## 2022-07-20 DIAGNOSIS — Z1231 Encounter for screening mammogram for malignant neoplasm of breast: Secondary | ICD-10-CM | POA: Diagnosis not present

## 2022-07-24 ENCOUNTER — Ambulatory Visit: Payer: Medicare Other | Admitting: Obstetrics & Gynecology

## 2022-07-24 ENCOUNTER — Encounter: Payer: Self-pay | Admitting: Obstetrics & Gynecology

## 2022-07-24 VITALS — BP 181/75 | HR 69 | Ht 62.0 in | Wt 147.6 lb

## 2022-07-24 DIAGNOSIS — Z79890 Hormone replacement therapy (postmenopausal): Secondary | ICD-10-CM

## 2022-07-24 MED ORDER — ESTRADIOL 0.025 MG/24HR TD PTWK: 0.0250 mg | MEDICATED_PATCH | TRANSDERMAL | 4 refills | Status: DC

## 2022-07-24 NOTE — Progress Notes (Addendum)
   GYN VISIT Patient name: Misty Hanson MRN [id number]  Date of birth: [birth year] Chief Complaint:   Follow-up (Medication f/u-having heart palpitations, anxiety)  History of Present Illness:   Misty Hanson is a 77 y.o. G2P2002 PM, [place] female being seen today for the following concerns:  HRT: Pt has been weening from .05 down.  She has been off for a few weeks now and has noted difficulty sleeping and heart palpitations.  Pt had a f/u visit with cardiology.  She would prefer to restart medication.  No LMP recorded. Patient has had a hysterectomy.     04/12/2022    2:21 PM  Depression screen PHQ 2/9  Decreased Interest 0  Down, Depressed, Hopeless 0  PHQ - 2 Score 0  Altered sleeping 0  Tired, decreased energy 1  Change in appetite 0  Feeling bad or failure about yourself  0  Trouble concentrating 0  Moving slowly or fidgety/restless 0  Suicidal thoughts 0  PHQ-9 Score 1     Review of Systems:   Pertinent items are noted in HPI Denies fever/chills, dizziness, headaches, visual disturbances, fatigue, shortness of breath, chest pain, abdominal pain, vomiting Pertinent History Reviewed:  Reviewed past medical,surgical, social, obstetrical and family history.  Reviewed problem list, medications and allergies. Physical Assessment:   Vitals:   07/24/22 1338  BP: (!) 181/75  Pulse: 69  Weight: 147 lb 9.6 oz (67 kg)  Height: 5\' 2"  (1.575 m)  Body mass index is 27 kg/m.       Physical Examination:   General appearance: alert, well appearing, and in no distress  Psych: mood appropriate, normal affect  Skin: warm & dry   Cardiovascular: normal heart rate noted, RRR  Respiratory: normal respiratory effort, no distress  Abdomen: soft, non-tender   Pelvic: examination not indicated  Extremities: no edema, no calf tenderness bilaterally  Chaperone: N/A    Assessment & Plan:  1) HRT - discussed "lowest dose for shortest time frame" - plan to restart 0.025mg  daily -  plan to follow yearly, may consider increasing if no improvement of symptoms  Meds ordered this encounter  Medications   estradiol (CLIMARA - DOSED IN MG/24 HR) 0.025 mg/24hr patch    Sig: Place 1 patch (0.025 mg total) onto the skin once a week.    Dispense:  12 patch    Refill:  4     Return in about 1 year (around 07/24/2023) for Annual.   Janyth Pupa, DO Attending [place], [place] for [facility], [place]

## 2022-08-21 NOTE — Progress Notes (Unsigned)
  Cardiology Office Note:   Date:  08/23/2022  ID:  Misty Hanson, DOB [birth year], MRN [id number]  History of Present Illness:   Misty Hanson is a 77 y.o. female who presents for evaluation of palpitations.    Her daughter-in-law is a physician next-door.  Since I last saw her she rarely gets palpitations.  She is not describing any sustained tacky arrhythmias.  She has had no presyncope or syncope.  She walks for exercise.  She is still taking care of her husband who has 2 kinds of cancer.  She sits with her sister who has Parkinson's.  She denies any chest pressure, neck or arm discomfort.  She has no new shortness of breath, PND or orthopnea.  She has had no weight gain or edema.  ROS: As stated in the HPI and negative for all other systems.  Studies Reviewed:    EKG: Sinus rhythm, rate 67, axis within normal limits, intervals within normal limits, no acute ST-T wave changes.   Risk Assessment/Calculations:      Physical Exam:   VS:  BP (!) 140/80   Pulse 67   Ht  (1.575 m)   Wt 147 lb (66.7 kg)   BMI 26.89 kg/m    Wt Readings from Last 3 Encounters:  08/23/22 147 lb (66.7 kg)  07/24/22 147 lb 9.6 oz (67 kg)  04/12/22 148 lb 12.8 oz (67.5 kg)     GEN: Well nourished, well developed in no acute distress NECK: No JVD; No carotid bruits CARDIAC: RRR, no murmurs, rubs, gallops RESPIRATORY:  Clear to auscultation without rales, wheezing or rhonchi  ABDOMEN: Soft, non-tender, non-distended EXTREMITIES:  No edema; No deformity   ASSESSMENT AND PLAN:   PALPITATIONS: She is not particular bothered by these.  No change in therapy.  I have suggested that she get a Kardia device.   HTN:   The blood pressure is not at target.  I am going to have her increase her amlodipine to 5 mg daily and keep a blood pressure diary.      Signed, Rollene Rotunda, MD

## 2022-08-23 ENCOUNTER — Encounter: Payer: Self-pay | Admitting: Cardiology

## 2022-08-23 ENCOUNTER — Ambulatory Visit: Payer: Medicare Other | Admitting: Cardiology

## 2022-08-23 VITALS — BP 140/80 | HR 67 | Ht 62.0 in | Wt 147.0 lb

## 2022-08-23 DIAGNOSIS — I10 Essential (primary) hypertension: Secondary | ICD-10-CM

## 2022-08-23 DIAGNOSIS — R002 Palpitations: Secondary | ICD-10-CM | POA: Diagnosis not present

## 2022-08-23 MED ORDER — AMLODIPINE BESYLATE 5 MG PO TABS: 5.0000 mg | ORAL_TABLET | Freq: Every day | ORAL | 3 refills | Status: DC

## 2022-08-23 NOTE — Patient Instructions (Signed)
Medication Instructions:  Please increase your Amlodipine to 5 mg once a day. Continue all other medications as listed.  *If you need a refill on your cardiac medications before your next appointment, please call your pharmacy*  Follow-Up: At [facility], you and your health needs are our priority.  As part of our continuing mission to provide you with exceptional heart care, we have created designated Provider Care Teams.  These Care Teams include your primary Cardiologist (physician) and Advanced Practice Providers (APPs -  Physician Assistants and Nurse Practitioners) who all work together to provide you with the care you need, when you need it.  We recommend signing up for the patient portal called "MyChart".  Sign up information is provided on this After Visit Summary.  MyChart is used to connect with patients for Virtual Visits (Telemedicine).  Patients are able to view lab/test results, encounter notes, upcoming appointments, etc.  Non-urgent messages can be sent to your provider as well.   To learn more about what you can do with MyChart, go to [email or web address].    Your next appointment:   1 year(s)  Provider:   Rollene Rotunda, MD      Nancie Neas

## 2022-08-26 ENCOUNTER — Other Ambulatory Visit: Payer: Self-pay | Admitting: Cardiology

## 2022-09-29 ENCOUNTER — Telehealth: Payer: Self-pay | Admitting: Cardiology

## 2022-09-29 NOTE — Telephone Encounter (Signed)
Pt c/o medication issue:  1. Name of Medication: amLODipine (NORVASC) 5 MG tablet   2. How are you currently taking this medication (dosage and times per day)? As prescribed   3. Are you having a reaction (difficulty breathing--STAT)?   4. What is your medication issue? Patient states that [facility] will not refill due to this medication also being sent to [facility]. She states she only needs it sent to [facility].

## 2022-09-29 NOTE — Telephone Encounter (Signed)
Unsure why the [facility]  She has already picked this up. Advised that she is set for [facility] going forward. Advised to call [facility] a few weeks before her refill is needed to ensure they send it.  They may not send automatic since her most recent refill was not with them.  Also called [facility] to make sure there are no further refills.  Spoke with staff to ensure medication cancelled on their end.

## 2022-10-02 ENCOUNTER — Other Ambulatory Visit: Payer: Self-pay

## 2022-10-02 ENCOUNTER — Telehealth: Payer: Self-pay | Admitting: Cardiology

## 2022-10-02 MED ORDER — METOPROLOL SUCCINATE ER 25 MG PO TB24
12.5000 mg | ORAL_TABLET | Freq: Two times a day (BID) | ORAL | 3 refills | Status: DC
Start: 2022-10-02 — End: 2023-10-01

## 2022-10-02 NOTE — Telephone Encounter (Signed)
Pt's medication was sent to pt's pharmacy as requested. Confirmation received.  °

## 2022-10-02 NOTE — Telephone Encounter (Signed)
*  STAT* If patient is at the pharmacy, call can be transferred to refill team.   1. Which medications need to be refilled? (please list name of each medication and dose if known)  amLODipine (NORVASC) 5 MG tablet   2. Which pharmacy/location (including street and city if local pharmacy) is medication to be sent to?  [facility] - [place], [place] - [address] [place]   3. Do they need a 30 day or 90 day supply?   90 day  Patient stated prescription was sent to [facility] in [place], [place] and that needs to be canceled so the prescription can be released to [facility]) - [place], [place] - [address] [place].  Patient stated she still has some medication left.

## 2023-02-14 DIAGNOSIS — E7800 Pure hypercholesterolemia, unspecified: Secondary | ICD-10-CM | POA: Diagnosis not present

## 2023-02-14 DIAGNOSIS — Z79899 Other long term (current) drug therapy: Secondary | ICD-10-CM | POA: Diagnosis not present

## 2023-02-14 DIAGNOSIS — Z299 Encounter for prophylactic measures, unspecified: Secondary | ICD-10-CM | POA: Diagnosis not present

## 2023-02-14 DIAGNOSIS — Z23 Encounter for immunization: Secondary | ICD-10-CM | POA: Diagnosis not present

## 2023-02-14 DIAGNOSIS — E559 Vitamin D deficiency, unspecified: Secondary | ICD-10-CM | POA: Diagnosis not present

## 2023-02-14 DIAGNOSIS — I10 Essential (primary) hypertension: Secondary | ICD-10-CM | POA: Diagnosis not present

## 2023-02-14 DIAGNOSIS — Z7189 Other specified counseling: Secondary | ICD-10-CM | POA: Diagnosis not present

## 2023-02-14 DIAGNOSIS — Z0000 Encounter for general adult medical examination without abnormal findings: Secondary | ICD-10-CM | POA: Diagnosis not present

## 2023-02-14 DIAGNOSIS — R5383 Other fatigue: Secondary | ICD-10-CM | POA: Diagnosis not present

## 2023-05-08 ENCOUNTER — Telehealth: Payer: Self-pay | Admitting: Obstetrics & Gynecology

## 2023-05-08 ENCOUNTER — Other Ambulatory Visit: Payer: Self-pay | Admitting: *Deleted

## 2023-05-08 DIAGNOSIS — Z79890 Hormone replacement therapy (postmenopausal): Secondary | ICD-10-CM

## 2023-05-08 MED ORDER — ESTRADIOL 0.025 MG/24HR TD PTWK: 0.0250 mg | MEDICATED_PATCH | TRANSDERMAL | 4 refills | Status: DC

## 2023-05-08 NOTE — Telephone Encounter (Signed)
 Dr. Charlotta Newton has refilled med.

## 2023-05-08 NOTE — Telephone Encounter (Signed)
 Patient states she only has 4 patches left. Recall for March. Please advise.

## 2023-07-09 ENCOUNTER — Other Ambulatory Visit: Payer: Self-pay | Admitting: Cardiology

## 2023-07-24 ENCOUNTER — Encounter: Payer: Self-pay | Admitting: Obstetrics & Gynecology

## 2023-07-24 ENCOUNTER — Ambulatory Visit: Payer: Medicare Other | Admitting: Obstetrics & Gynecology

## 2023-07-24 VITALS — BP 157/92 | HR 71 | Ht 62.0 in | Wt 141.8 lb

## 2023-07-24 DIAGNOSIS — Z90710 Acquired absence of both cervix and uterus: Secondary | ICD-10-CM

## 2023-07-24 DIAGNOSIS — Z79890 Hormone replacement therapy (postmenopausal): Secondary | ICD-10-CM

## 2023-07-24 MED ORDER — ESTRADIOL 0.025 MG/24HR TD PTWK: 0.0250 mg | MEDICATED_PATCH | TRANSDERMAL | 4 refills | Status: AC

## 2023-07-24 NOTE — Progress Notes (Signed)
 WELL-WOMAN EXAMINATION Patient name: Misty Hanson MRN [id number]  Date of birth: 1946-03-08 Chief Complaint:   Gynecologic Exam  History of Present Illness:   Misty Hanson is a 78 y.o. G2P2002 PM, PH female being seen today for a routine annual exam regarding:  -HRT: Currently on low dose estrogen patch.  Today she notes that she is doing ok.  Notes some changes with skin and hair.  Denies not flashes or night sweats, a little bit of trouble with sleeping.    Seeing a counselor- husband has recurrence of cancer- going to [facility]   No LMP recorded. Patient has had a hysterectomy.  Last pap NA.  Last mammogram: scheduled tomorrow. Last colonoscopy: 2022     04/12/2022    2:21 PM  Depression screen PHQ 2/9  Decreased Interest 0  Down, Depressed, Hopeless 0  PHQ - 2 Score 0  Altered sleeping 0  Tired, decreased energy 1  Change in appetite 0  Feeling bad or failure about yourself  0  Trouble concentrating 0  Moving slowly or fidgety/restless 0  Suicidal thoughts 0  PHQ-9 Score 1      Review of Systems:   Pertinent items are noted in HPI Denies any headaches, blurred vision, fatigue, shortness of breath, chest pain, abdominal pain, bowel movements, urination, or intercourse unless otherwise stated above.  Pertinent History Reviewed:  Reviewed past medical,surgical, social and family history.  Reviewed problem list, medications and allergies. Physical Assessment:   Vitals:   07/24/23 1419 07/24/23 1436  BP: (!) 162/75 (!) 157/92  Pulse: 71   Weight: 141 lb 12.8 oz (64.3 kg)   Height: 5\' 2"  (1.575 m)   Body mass index is 25.94 kg/m.        Physical Examination:   General appearance - well appearing, and in no distress  Mental status - alert, oriented to person, place, and time  Psych:  She has a normal mood and affect  Skin - warm and dry, normal color, no suspicious lesions noted  Chest - effort normal, all lung fields clear to auscultation  bilaterally  Heart - normal rate and regular rhythm  Neck:  midline trachea, no thyromegaly or nodules  Breasts - breasts appear normal, no suspicious masses, no skin or nipple changes or  axillary nodes  Abdomen - soft, nontender, nondistended, no masses or organomegaly  Pelvic - VULVA: normal appearing vulva with no masses, tenderness or lesions  VAGINA: normal appearing vagina with normal color and discharge, no lesions.  Uterus and cervix surgically absent.  No abnormalities note don bimanual exam.  ADNEXA: No adnexal masses or tenderness noted.  Extremities:  No swelling or varicosities noted  Chaperone:  pt declined      Assessment & Plan:  1) HRT -doing well with current medication -reviewed risk/benefit -pt denies any significant change to cardiac history  2) Preventive screening -pap not indicated -mammogram scheduled for tmr -colonoscopy up to date  No orders of the defined types were placed in this encounter.   Meds:  Meds ordered this encounter  Medications   estradiol (CLIMARA - DOSED IN MG/24 HR) 0.025 mg/24hr patch    Sig: Place 1 patch (0.025 mg total) onto the skin once a week.    Dispense:  12 patch    Refill:  4    Follow-up: Return in about 1 year (around 07/23/2024) for HRT/annual follow up.   Myna Hidalgo, DO Attending Obstetrician & Gynecologist, [facility] for [employer],  [facility]

## 2023-07-25 DIAGNOSIS — Z1231 Encounter for screening mammogram for malignant neoplasm of breast: Secondary | ICD-10-CM | POA: Diagnosis not present

## 2023-08-21 NOTE — Progress Notes (Unsigned)
  Cardiology Office Note:   Date:  08/22/2023  ID:  KELLSIE GRINDLE, DOB 1946/03/03, MRN [id number] PCP: Orlena Bitters, MD  [facility] Providers Cardiologist:  Eilleen Grates, MD {  History of Present Illness:   Misty Hanson is a 78 y.o. female who presents for evaluation of palpitations.    Her daughter-in-law is a physician next-door.  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    She does walk for exercise and she has 2 flights of stairs.   The patient denies any new symptoms such as chest discomfort, neck or arm discomfort. There has been no new shortness of breath, PND or orthopnea. There have been no reported palpitations, presyncope or syncope.     ROS: As stated in the HPI and negative for all other systems.  Studies Reviewed:    EKG:   EKG Interpretation Date/Time:  Wednesday August 22 2023 15:21:20 EDT Ventricular Rate:  70 PR Interval:  156 QRS Duration:  82 QT Interval:  390 QTC Calculation: 421 R Axis:   9  Text Interpretation: Normal sinus rhythm Normal ECG When compared with ECG of 01-Jun-2015 14:40, No significant change since last tracing Confirmed by Eilleen Grates ([id number]) on 08/22/2023 3:44:52 PM    Risk Assessment/Calculations:       Physical Exam:   VS:  BP (!) 154/72   Pulse 70   Ht 5\' 2"  (1.575 m)   Wt 144 lb (65.3 kg)   BMI 26.34 kg/m    Wt Readings from Last 3 Encounters:  08/22/23 144 lb (65.3 kg)  07/24/23 141 lb 12.8 oz (64.3 kg)  08/23/22 147 lb (66.7 kg)     GEN: Well nourished, well developed in no acute distress NECK: No JVD; No carotid bruits CARDIAC: RRR, no murmurs, rubs, gallops RESPIRATORY:  Clear to auscultation without rales, wheezing or rhonchi  ABDOMEN: Soft, non-tender, non-distended EXTREMITIES:  No edema; No deformity   ASSESSMENT AND PLAN:   PALPITATIONS: The patient denies any new palpitations.  No change in therapy.  She will let me know if  these increase and we can treat them symptomatically.   HTN:   The blood pressure is mildly elevated.  I will increase the Norvasc  to 7.5 mg PO daily.  She will keep her BP diary.     Follow up with me as needed.   Signed, Eilleen Grates, MD

## 2023-08-22 ENCOUNTER — Encounter: Payer: Self-pay | Admitting: Cardiology

## 2023-08-22 ENCOUNTER — Ambulatory Visit: Payer: Medicare Other | Admitting: Cardiology

## 2023-08-22 VITALS — BP 154/72 | HR 70 | Ht 62.0 in | Wt 144.0 lb

## 2023-08-22 DIAGNOSIS — R002 Palpitations: Secondary | ICD-10-CM

## 2023-08-22 DIAGNOSIS — I10 Essential (primary) hypertension: Secondary | ICD-10-CM

## 2023-08-22 MED ORDER — AMLODIPINE BESYLATE 5 MG PO TABS: 7.5000 mg | ORAL_TABLET | Freq: Every day | ORAL | 3 refills | Status: DC

## 2023-08-22 NOTE — Patient Instructions (Addendum)
 Medication Instructions:  Please increase Amlodipine  to 7.5 mg a day (1 and 1/2 tablets). Continue all other medications as listed.  *If you need a refill on your cardiac medications before your next appointment, please call your pharmacy*  Follow-Up: At [facility], you and your health needs are our priority.  As part of our continuing mission to provide you with exceptional heart care, our providers are all part of one team.  This team includes your primary Cardiologist (physician) and Advanced Practice Providers or APPs (Physician Assistants and Nurse Practitioners) who all work together to provide you with the care you need, when you need it.  Your next appointment:   Follow up as needed ' We recommend signing up for the patient portal called "MyChart".  Sign up information is provided on this After Visit Summary.  MyChart is used to connect with patients for Virtual Visits (Telemedicine).  Patients are able to view lab/test results, encounter notes, upcoming appointments, etc.  Non-urgent messages can be sent to your provider as well.   To learn more about what you can do with MyChart, go to [email or web address].

## 2023-09-30 ENCOUNTER — Other Ambulatory Visit: Payer: Self-pay | Admitting: Cardiology

## 2024-01-16 DIAGNOSIS — H40023 Open angle with borderline findings, high risk, bilateral: Secondary | ICD-10-CM | POA: Diagnosis not present

## 2024-01-16 DIAGNOSIS — H52223 Regular astigmatism, bilateral: Secondary | ICD-10-CM | POA: Diagnosis not present

## 2024-01-16 DIAGNOSIS — H5203 Hypermetropia, bilateral: Secondary | ICD-10-CM | POA: Diagnosis not present

## 2024-01-16 DIAGNOSIS — H524 Presbyopia: Secondary | ICD-10-CM | POA: Diagnosis not present

## 2024-05-07 ENCOUNTER — Other Ambulatory Visit: Payer: Self-pay | Admitting: Cardiology

## 2024-07-28 ENCOUNTER — Ambulatory Visit: Admitting: Obstetrics & Gynecology
